# Patient Record
Sex: Male | Born: 1960 | Race: White | Hispanic: No | Marital: Married | Smoking: Current some day smoker
Health system: Southern US, Community
[De-identification: ages and names within clinical notes are randomized; demographics above are authoritative.]

## PROBLEM LIST (undated history)

## (undated) DIAGNOSIS — Z8739 Personal history of other diseases of the musculoskeletal system and connective tissue: Secondary | ICD-10-CM

## (undated) DIAGNOSIS — R519 Headache, unspecified: Secondary | ICD-10-CM

## (undated) DIAGNOSIS — Z8719 Personal history of other diseases of the digestive system: Secondary | ICD-10-CM

## (undated) DIAGNOSIS — Z8709 Personal history of other diseases of the respiratory system: Secondary | ICD-10-CM

## (undated) DIAGNOSIS — J45909 Unspecified asthma, uncomplicated: Secondary | ICD-10-CM

## (undated) DIAGNOSIS — M2550 Pain in unspecified joint: Secondary | ICD-10-CM

## (undated) DIAGNOSIS — Z8639 Personal history of other endocrine, nutritional and metabolic disease: Secondary | ICD-10-CM

## (undated) DIAGNOSIS — K219 Gastro-esophageal reflux disease without esophagitis: Secondary | ICD-10-CM

## (undated) DIAGNOSIS — Z860100 Personal history of colon polyps, unspecified: Secondary | ICD-10-CM

## (undated) DIAGNOSIS — M1990 Unspecified osteoarthritis, unspecified site: Secondary | ICD-10-CM

## (undated) DIAGNOSIS — G4730 Sleep apnea, unspecified: Secondary | ICD-10-CM

## (undated) DIAGNOSIS — J189 Pneumonia, unspecified organism: Secondary | ICD-10-CM

## (undated) DIAGNOSIS — I10 Essential (primary) hypertension: Secondary | ICD-10-CM

## (undated) DIAGNOSIS — Z86718 Personal history of other venous thrombosis and embolism: Secondary | ICD-10-CM

## (undated) DIAGNOSIS — Z87442 Personal history of urinary calculi: Secondary | ICD-10-CM

## (undated) HISTORY — PX: UMBILICAL HERNIA REPAIR: SHX196

## (undated) HISTORY — PX: DOPPLER ECHOCARDIOGRAPHY: SHX263

## (undated) HISTORY — PX: SHOULDER SURGERY: SHX246

## (undated) HISTORY — PX: ESOPHAGOGASTRODUODENOSCOPY: SHX1529

## (undated) HISTORY — PX: TONSILLECTOMY: SUR1361

## (undated) HISTORY — PX: LITHOTRIPSY: SUR834

## (undated) HISTORY — PX: COLONOSCOPY: SHX174

## (undated) HISTORY — PX: CARDIAC CATHETERIZATION: SHX172

---

## 2011-05-16 HISTORY — PX: BACK SURGERY: SHX140

## 2014-05-15 DIAGNOSIS — Z8709 Personal history of other diseases of the respiratory system: Secondary | ICD-10-CM

## 2014-05-15 HISTORY — DX: Personal history of other diseases of the respiratory system: Z87.09

## 2016-04-25 ENCOUNTER — Ambulatory Visit (INDEPENDENT_AMBULATORY_CARE_PROVIDER_SITE_OTHER): Payer: Self-pay

## 2016-04-25 ENCOUNTER — Encounter (INDEPENDENT_AMBULATORY_CARE_PROVIDER_SITE_OTHER): Payer: Self-pay | Admitting: Orthopaedic Surgery

## 2016-04-25 ENCOUNTER — Ambulatory Visit (INDEPENDENT_AMBULATORY_CARE_PROVIDER_SITE_OTHER): Payer: BLUE CROSS/BLUE SHIELD | Admitting: Orthopaedic Surgery

## 2016-04-25 DIAGNOSIS — M1611 Unilateral primary osteoarthritis, right hip: Secondary | ICD-10-CM | POA: Diagnosis not present

## 2016-04-25 NOTE — Progress Notes (Signed)
Office Visit Note   Patient: Jeffrey BuntingDavid Mccormick           Date of Birth: 03/27/1961           MRN: [id number] Visit Date: 04/25/2016              Requested by: No referring provider defined for this encounter. PCP: No PCP Per Patient   Assessment & Plan: Visit Diagnoses:  1. Primary osteoarthritis of right hip     Plan: X-rays were reviewed with the patient and his wife. He has end-stage right hip degenerative joint disease. I showed him on a model what the surgery entails and the risks benefits alternatives related to this. At this point he has failed extensive conservative treatment and wishes to proceed with total hip replacement. We will get him scheduled in the near future. Questions encouraged and answered. He denies any history of DVT. He smokes 1 pack of cigarettes per 2 weeks.  Follow-Up Instructions: No Follow-up on file.   Orders:  Orders Placed This Encounter  Procedures  . XR HIP UNILAT W OR W/O PELVIS 2-3 VIEWS RIGHT   No orders of the defined types were placed in this encounter.     Procedures: No procedures performed   Clinical Data: No additional findings.   Subjective: Chief Complaint  Patient presents with  . Right Hip - Pain    Patient is a 55 year old pleasant gentleman who comes in with chronic right hip pain and groin pain with severe limitation in activity and quality of life. He works at the [employer] in [place]. He does endorse limping. He's had cortisone injections in the past with temporary relief. He takes ibuprofen for pain. He has had a previous spinal fusion. The pain does not radiate and is a gnawing pain. It is worse with activity.    Review of Systems  Constitutional: Negative.   HENT: Negative.   Eyes: Negative.   Respiratory: Negative.   Cardiovascular: Negative.   Gastrointestinal: Negative.   Endocrine: Negative.   Genitourinary: Negative.   Musculoskeletal: Negative.   Skin: Negative.     Allergic/Immunologic: Negative.   Neurological: Negative.   Hematological: Negative.   Psychiatric/Behavioral: Negative.      Objective: Vital Signs: There were no vitals taken for this visit.  Physical Exam  Constitutional: [name] oriented to person, place, and time. He appears well-developed and well-nourished.  HENT:  Head: Normocephalic and atraumatic.  Eyes: EOM are normal.  Neck: Neck supple.  Cardiovascular: Intact distal pulses.   Pulmonary/Chest: Effort normal.  Abdominal: Soft.  Neurological: [name] alert and oriented to person, place, and time.  Skin: Skin is warm.  Psychiatric: He has a normal mood and affect. His behavior is normal. Judgment and thought content normal.  Nursing note and vitals reviewed.   Ortho Exam Exam of the right hip shows very limited range of motion. Positive Stinchfield sign. Lateral hip is nontender. Pain with movement of the hip. No knee symptoms. Specialty Comments:  No specialty comments available.  Imaging: Xr Hip Unilat W Or W/o Pelvis 2-3 Views Right  Result Date: 04/25/2016 Advanced DJD right hip    PMFS History: Patient Active Problem List   Diagnosis Date Noted  . Primary osteoarthritis of right hip 04/25/2016   No past medical history on file.  No family history on file.  No past surgical history on file. Social History   Occupational History  . Not on file.   Social History Main  Topics  . Smoking status: Smoker, Current Status Unknown  . Smokeless tobacco: Never Used  . Alcohol use Not on file  . Drug use: Unknown  . Sexual activity: Not on file

## 2016-05-19 ENCOUNTER — Other Ambulatory Visit (INDEPENDENT_AMBULATORY_CARE_PROVIDER_SITE_OTHER): Payer: Self-pay | Admitting: Orthopaedic Surgery

## 2016-05-19 DIAGNOSIS — M1611 Unilateral primary osteoarthritis, right hip: Secondary | ICD-10-CM

## 2016-05-23 ENCOUNTER — Encounter (HOSPITAL_COMMUNITY)
Admission: RE | Admit: 2016-05-23 | Discharge: 2016-05-23 | Disposition: A | Discharge status: Home / Self Care | Payer: BLUE CROSS/BLUE SHIELD | Source: Ambulatory Visit | Attending: Orthopaedic Surgery | Admitting: Orthopaedic Surgery

## 2016-05-23 ENCOUNTER — Encounter (HOSPITAL_COMMUNITY): Payer: Self-pay

## 2016-05-23 DIAGNOSIS — Z01812 Encounter for preprocedural laboratory examination: Secondary | ICD-10-CM

## 2016-05-23 DIAGNOSIS — M1611 Unilateral primary osteoarthritis, right hip: Secondary | ICD-10-CM

## 2016-05-23 HISTORY — DX: Unspecified asthma, uncomplicated: J45.909

## 2016-05-23 HISTORY — DX: Personal history of other endocrine, nutritional and metabolic disease: Z86.39

## 2016-05-23 HISTORY — DX: Pneumonia, unspecified organism: J18.9

## 2016-05-23 HISTORY — DX: Sleep apnea, unspecified: G47.30

## 2016-05-23 HISTORY — DX: Personal history of other diseases of the respiratory system: Z87.09

## 2016-05-23 HISTORY — DX: Personal history of other diseases of the digestive system: Z87.19

## 2016-05-23 HISTORY — DX: Personal history of urinary calculi: Z87.442

## 2016-05-23 HISTORY — DX: Gastro-esophageal reflux disease without esophagitis: K21.9

## 2016-05-23 HISTORY — DX: Unspecified osteoarthritis, unspecified site: M19.90

## 2016-05-23 HISTORY — DX: Personal history of colonic polyps: Z86.010

## 2016-05-23 HISTORY — DX: Pain in unspecified joint: M25.50

## 2016-05-23 HISTORY — DX: Personal history of other venous thrombosis and embolism: Z86.718

## 2016-05-23 HISTORY — DX: Personal history of colon polyps, unspecified: Z86.0100

## 2016-05-23 HISTORY — DX: Essential (primary) hypertension: I10

## 2016-05-23 HISTORY — DX: Personal history of other diseases of the musculoskeletal system and connective tissue: Z87.39

## 2016-05-23 LAB — COMPREHENSIVE METABOLIC PANEL
ALT: 38 U/L (ref 17–63)
AST: 26 U/L (ref 15–41)
Albumin: 3.9 g/dL (ref 3.5–5.0)
Alkaline Phosphatase: 78 U/L (ref 38–126)
Anion gap: 8 (ref 5–15)
BUN: 13 mg/dL (ref 6–20)
CO2: 28 mmol/L (ref 22–32)
Calcium: 9.8 mg/dL (ref 8.9–10.3)
Chloride: 103 mmol/L (ref 101–111)
Creatinine, Ser: 1.09 mg/dL (ref 0.61–1.24)
GFR calc Af Amer: >60 mL/min (ref >60)
GFR calc non Af Amer: >60 mL/min (ref >60)
Glucose, Bld: 114 mg/dL — ABNORMAL HIGH (ref 65–99)
Potassium: 4.1 mmol/L (ref 3.5–5.1)
Sodium: 139 mmol/L (ref 135–145)
Total Bilirubin: 0.6 mg/dL (ref 0.3–1.2)
Total Protein: 6.5 g/dL (ref 6.5–8.1)

## 2016-05-23 LAB — CBC WITH DIFFERENTIAL/PLATELET
Basophils Absolute: 0.1 10*3/uL (ref 0.0–0.1)
Basophils Relative: 1 %
Eosinophils Absolute: 0.2 10*3/uL (ref 0.0–0.7)
Eosinophils Relative: 3 %
HCT: 49 % (ref 39.0–52.0)
Hemoglobin: 16.2 g/dL (ref 13.0–17.0)
Lymphocytes Relative: 38 %
Lymphs Abs: 2.8 10*3/uL (ref 0.7–4.0)
MCH: 27.4 pg (ref 26.0–34.0)
MCHC: 33.1 g/dL (ref 30.0–36.0)
MCV: 82.8 fL (ref 78.0–100.0)
Monocytes Absolute: 0.6 10*3/uL (ref 0.1–1.0)
Monocytes Relative: 8 %
Neutro Abs: 3.7 10*3/uL (ref 1.7–7.7)
Neutrophils Relative %: 50 %
Platelets: 241 10*3/uL (ref 150–400)
RBC: 5.92 MIL/uL — ABNORMAL HIGH (ref 4.22–5.81)
RDW: 13.5 % (ref 11.5–15.5)
WBC: 7.4 10*3/uL (ref 4.0–10.5)

## 2016-05-23 LAB — TYPE AND SCREEN
ABO/RH(D): O NEG
Antibody Screen: NEGATIVE

## 2016-05-23 LAB — URINALYSIS, ROUTINE W REFLEX MICROSCOPIC
Bilirubin Urine: NEGATIVE
Glucose, UA: NEGATIVE mg/dL
Hgb urine dipstick: NEGATIVE
Ketones, ur: NEGATIVE mg/dL
Leukocytes, UA: NEGATIVE
Nitrite: NEGATIVE
Protein, ur: NEGATIVE mg/dL
Specific Gravity, Urine: 1.019 (ref 1.005–1.030)
pH: 5 (ref 5.0–8.0)

## 2016-05-23 LAB — ABO/RH: ABO/RH(D): O NEG

## 2016-05-23 LAB — C-REACTIVE PROTEIN: CRP: 1.4 mg/dL — ABNORMAL HIGH (ref <1.0)

## 2016-05-23 LAB — PROTIME-INR
INR: 0.91
Prothrombin Time: 12.2 seconds (ref 11.4–15.2)

## 2016-05-23 LAB — APTT: aPTT: 29 seconds (ref 24–36)

## 2016-05-23 LAB — SURGICAL PCR SCREEN
MRSA, PCR: NEGATIVE
Staphylococcus aureus: POSITIVE — AB

## 2016-05-23 LAB — SEDIMENTATION RATE: Sed Rate: 1 mm/hr (ref 0–16)

## 2016-05-23 MED ORDER — CHLORHEXIDINE GLUCONATE 4 % EX LIQD: 60.0000 mL | Freq: Once | CUTANEOUS | Status: DC

## 2016-05-23 NOTE — Progress Notes (Addendum)
Cardiologist is Dr.Kotlaba-last visit about 3 months ago.Does have an appointment 06/03/16-is going to reschedule d/t surgery on 05/25/16  Medical Md doesn't have one  Echo in Spring of 17to request from Cardiogist  Stress test in Spring of 17to request from Cardiologist   Heart cath denies  EKG to request from cardiologist from Spring  2017  CXR unsure but will request from Cardiologist

## 2016-05-23 NOTE — Pre-Procedure Instructions (Signed)
Rob BuntingDavid John  05/23/2016      [id number]- [place], [place] - [address] [facility] [address] [place] [id number] Phone: [phone] Fax: [phone]    Your procedure is scheduled on Thurs, Jan 11 @ 12:15 PM  [facility] Admitting at 10:15 AM  Call this number if you have problems the morning of surgery:  [phone]   Remember:  Do not eat food or drink liquids after midnight.  Take these medicines the morning of surgery with A SIP OF WATER Albuterol<Bring Your Inhaler With You>,Amlodipine(Norvasc),Pain Pill(if needed),and Protonix(Pantoprazole)             Stop taking Ibuprofen along with any Vitamins or Herbal Medications. No Goody's,BC's,Aleve,Advil,Mortin,or Fish Oil.    Do not wear jewelry.  Do not wear lotions, powders,colognes, or deoderant.             Men may shave face and neck.  Do not bring valuables to the hospital.  [facility] is not responsible for any belongings or valuables.  Contacts, dentures or bridgework may not be worn into surgery.  Leave your suitcase in the car.  After surgery it may be brought to your room.  For patients admitted to the hospital, discharge time will be determined by your treatment team.  Patients discharged the day of surgery will not be allowed to drive home.    [facility] Pre[facility] for [facility]  Before surgery, you can play an important role.  Because skin is not sterile, your skin needs to be as free of germs as possible.  You can reduce the number of germs on you skin by washing with CHG (chlorahexidine gluconate) soap before surgery.  CHG is an antiseptic cleaner which kills germs and bonds with the skin to continue killing germs even after washing.  Please DO NOT use if you have an allergy to CHG or antibacterial soaps.  If your skin becomes reddened/irritated stop using the CHG and inform your nurse when you arrive at Short Stay.  Do not  shave (including legs and underarms) for at least 48 hours prior to the first CHG shower.  You may shave your face.  Please follow these instructions carefully:   1.  Shower with CHG Soap the night before surgery and the                                morning of Surgery.  2.  If you choose to wash your hair, wash your hair first as usual with your       normal shampoo.  3.  After you shampoo, rinse your hair and body thoroughly to remove the                      Shampoo.  4.  Use CHG as you would any other liquid soap.  You can apply chg directly       to the skin and wash gently with scrungie or a clean washcloth.  5.  Apply the CHG Soap to your body ONLY FROM THE NECK DOWN.        Do not use on open wounds or open sores.  Avoid contact with your eyes,       ears, mouth and genitals (private parts).  Wash genitals (private parts)       with your normal  soap.  6.  Wash thoroughly, paying special attention to the area where your surgery        will be performed.  7.  Thoroughly rinse your body with warm water from the neck down.  8.  DO NOT shower/wash with your normal soap after using and rinsing off       the CHG Soap.  9.  Pat yourself dry with a clean towel.            10.  Wear clean pajamas.            11.  Place clean sheets on your bed the night of your first shower and do not        sleep with pets.  Day of Surgery  Do not apply any lotions/deoderants the morning of surgery.  Please wear clean clothes to the hospital/surgery center.    Please read over the following fact sheets that you were given. Pain Booklet, Coughing and Deep Breathing, MRSA Information and Surgical Site Infection Prevention

## 2016-05-24 ENCOUNTER — Encounter (HOSPITAL_COMMUNITY): Payer: Self-pay

## 2016-05-24 MED ORDER — DEXTROSE 5 % IV SOLN
3.0000 g | INTRAVENOUS | Status: AC
  Administered 2016-05-25: 2 g via INTRAVENOUS
  Administered 2016-05-25: 1 g via INTRAVENOUS
  Filled 2016-05-24: qty 3000

## 2016-05-24 NOTE — Progress Notes (Addendum)
Anesthesia Chart Review: Patient is a 56 year old male scheduled for right THA on 05/25/2016 by Dr. Roda ShuttersXu.  History includes smoking, hypertension, GERD, OSA (intolerant to CPAP), hyperlipidemia, asthma, hiatal hernia, nephrolithiasis, arthritis, gout, tonsillectomy, umbilical hernia repair '10, L3-4 posterior TLIF 10/18/11, left rotator cuff repair '05, "superficial" blood clot history. BMI is consistent with obesity.   No PCP per patient, but sees cardiologist Dr. Kathryne SharperKotlaba. Last visit 11/27/15 for HTN follow-up. By notes, [facility] and had non-ischemic testing 09/2015. GERD thought to be the likely cause of his symptoms. He ultimately was started on anti-hypertensive medication.  Meds include albuterol, amlodipine, Norco, Protonix, Viagra.  BP 117/78   Pulse 84   Temp 36.7 C   Resp 20   Ht 6\' 1"  (1.854 m)   Wt 283 lb 3.2 oz (128.5 kg)   SpO2 96%   BMI 37.36 kg/m   EKG 10/29/15 ([facility]): Sinus rhythm, old inferior-apical infarct.  Stress Echo 09/24/15 [facility] Interpretation: 1. Clinically negative stress test with no reproduce chest pain. 2. ECG nonspecific response. 3. Echocardiogram showed normal LV systolic function at rest with mild LVH and ejection fraction over 60%. There is a grade 1 left ventricular diastolic relaxation delay with normal estimated resting LV systolic pressures. 4. With exercise, there is appropriate augmentation of contractility in all segments. Left ventricular contrast with Definity was used. 5. Post exercise, there is no significant increase in the tricuspid regurgitation velocity which still remains to be in the 2-3 m/s range and there is no induced dynamic gradient. Summary: No induced ischemia at 89% of PMHR. Hypertensive blood pressure response. Plan: Discharge home. Out-patient management of blood pressure as well as GERD.   09/24/15 1V CXR [facility]): Impression: No acute cardiopulmonary disease. Minimal scar versus  atelectasis within the lung bases.  Preoperative labs noted.   Patient with non-ischemic stress echo within the past year. BP is good on medication. If no acute changes then I anticipate that he can proceed as planned.  Velna Ochsllison Johntae Broxterman, PA-C [facility]/Anesthesiology Phone [phone] 05/24/2016 1:52 PM

## 2016-05-25 ENCOUNTER — Inpatient Hospital Stay (HOSPITAL_COMMUNITY): Payer: BLUE CROSS/BLUE SHIELD | Admitting: Vascular Surgery

## 2016-05-25 ENCOUNTER — Inpatient Hospital Stay (HOSPITAL_COMMUNITY)
Admission: RE | Admit: 2016-05-25 | Discharge: 2016-05-27 | DRG: 470 | Disposition: A | Discharge status: Home Health | Payer: BLUE CROSS/BLUE SHIELD | Source: Ambulatory Visit | Attending: Orthopaedic Surgery | Admitting: Orthopaedic Surgery

## 2016-05-25 ENCOUNTER — Encounter (HOSPITAL_COMMUNITY)
Admission: RE | Disposition: A | Discharge status: Home Health | Payer: Self-pay | Source: Ambulatory Visit | Attending: Orthopaedic Surgery

## 2016-05-25 ENCOUNTER — Encounter (HOSPITAL_COMMUNITY): Payer: Self-pay | Admitting: *Deleted

## 2016-05-25 ENCOUNTER — Inpatient Hospital Stay (HOSPITAL_COMMUNITY): Payer: BLUE CROSS/BLUE SHIELD

## 2016-05-25 ENCOUNTER — Inpatient Hospital Stay (HOSPITAL_COMMUNITY): Payer: BLUE CROSS/BLUE SHIELD | Admitting: Certified Registered"

## 2016-05-25 DIAGNOSIS — D62 Acute posthemorrhagic anemia: Secondary | ICD-10-CM | POA: Diagnosis not present

## 2016-05-25 DIAGNOSIS — G4730 Sleep apnea, unspecified: Secondary | ICD-10-CM | POA: Diagnosis present

## 2016-05-25 DIAGNOSIS — Z6837 Body mass index (BMI) 37.0-37.9, adult: Secondary | ICD-10-CM | POA: Diagnosis not present

## 2016-05-25 DIAGNOSIS — R262 Difficulty in walking, not elsewhere classified: Secondary | ICD-10-CM

## 2016-05-25 DIAGNOSIS — K219 Gastro-esophageal reflux disease without esophagitis: Secondary | ICD-10-CM | POA: Diagnosis present

## 2016-05-25 DIAGNOSIS — F17290 Nicotine dependence, other tobacco product, uncomplicated: Secondary | ICD-10-CM | POA: Diagnosis present

## 2016-05-25 DIAGNOSIS — M109 Gout, unspecified: Secondary | ICD-10-CM | POA: Diagnosis present

## 2016-05-25 DIAGNOSIS — E785 Hyperlipidemia, unspecified: Secondary | ICD-10-CM | POA: Diagnosis present

## 2016-05-25 DIAGNOSIS — J45909 Unspecified asthma, uncomplicated: Secondary | ICD-10-CM | POA: Diagnosis present

## 2016-05-25 DIAGNOSIS — M1611 Unilateral primary osteoarthritis, right hip: Secondary | ICD-10-CM | POA: Diagnosis present

## 2016-05-25 DIAGNOSIS — M25551 Pain in right hip: Secondary | ICD-10-CM | POA: Diagnosis present

## 2016-05-25 DIAGNOSIS — Z981 Arthrodesis status: Secondary | ICD-10-CM | POA: Diagnosis not present

## 2016-05-25 DIAGNOSIS — E669 Obesity, unspecified: Secondary | ICD-10-CM | POA: Diagnosis present

## 2016-05-25 DIAGNOSIS — I10 Essential (primary) hypertension: Secondary | ICD-10-CM | POA: Diagnosis present

## 2016-05-25 DIAGNOSIS — Z79899 Other long term (current) drug therapy: Secondary | ICD-10-CM | POA: Diagnosis not present

## 2016-05-25 DIAGNOSIS — Z86718 Personal history of other venous thrombosis and embolism: Secondary | ICD-10-CM

## 2016-05-25 DIAGNOSIS — Z419 Encounter for procedure for purposes other than remedying health state, unspecified: Secondary | ICD-10-CM

## 2016-05-25 DIAGNOSIS — Z96649 Presence of unspecified artificial hip joint: Secondary | ICD-10-CM

## 2016-05-25 HISTORY — PX: TOTAL HIP ARTHROPLASTY: SHX124

## 2016-05-25 LAB — POCT I-STAT 4, (NA,K, GLUC, HGB,HCT)
Glucose, Bld: 124 mg/dL — ABNORMAL HIGH (ref 65–99)
HCT: 32 % — ABNORMAL LOW (ref 39.0–52.0)
Hemoglobin: 10.9 g/dL — ABNORMAL LOW (ref 13.0–17.0)
Potassium: 4 mmol/L (ref 3.5–5.1)
Sodium: 139 mmol/L (ref 135–145)

## 2016-05-25 SURGERY — ARTHROPLASTY, HIP, TOTAL, ANTERIOR APPROACH
Anesthesia: Spinal | Site: Hip | Laterality: Right

## 2016-05-25 MED ORDER — ALBUTEROL SULFATE (2.5 MG/3ML) 0.083% IN NEBU: 2.5000 mg | INHALATION_SOLUTION | Freq: Four times a day (QID) | RESPIRATORY_TRACT | Status: DC | PRN

## 2016-05-25 MED ORDER — LIDOCAINE 2% (20 MG/ML) 5 ML SYRINGE
INTRAMUSCULAR | Status: AC
  Filled 2016-05-25: qty 10

## 2016-05-25 MED ORDER — ACETAMINOPHEN 325 MG PO TABS: 650.0000 mg | ORAL_TABLET | Freq: Four times a day (QID) | ORAL | Status: DC | PRN

## 2016-05-25 MED ORDER — ALBUMIN HUMAN 5 % IV SOLN
12.5000 g | Freq: Once | INTRAVENOUS | Status: AC
  Administered 2016-05-25: 12.5 g via INTRAVENOUS

## 2016-05-25 MED ORDER — SODIUM CHLORIDE 0.9 % IV SOLN
INTRAVENOUS | Status: DC
  Administered 2016-05-25 – 2016-05-26 (×2): via INTRAVENOUS

## 2016-05-25 MED ORDER — POLYETHYLENE GLYCOL 3350 17 G PO PACK
17.0000 g | PACK | Freq: Every day | ORAL | Status: DC | PRN
  Administered 2016-05-26 – 2016-05-27 (×2): 17 g via ORAL
  Filled 2016-05-25 (×2): qty 1

## 2016-05-25 MED ORDER — AMLODIPINE BESYLATE 2.5 MG PO TABS
2.5000 mg | ORAL_TABLET | Freq: Every day | ORAL | Status: DC
  Administered 2016-05-27: 2.5 mg via ORAL
  Filled 2016-05-25: qty 1

## 2016-05-25 MED ORDER — 0.9 % SODIUM CHLORIDE (POUR BTL) OPTIME
TOPICAL | Status: DC | PRN
  Administered 2016-05-25: 1000 mL

## 2016-05-25 MED ORDER — DEXAMETHASONE SODIUM PHOSPHATE 10 MG/ML IJ SOLN
10.0000 mg | Freq: Once | INTRAMUSCULAR | Status: AC
  Administered 2016-05-26: 10 mg via INTRAVENOUS
  Filled 2016-05-25: qty 1

## 2016-05-25 MED ORDER — TRANEXAMIC ACID 1000 MG/10ML IV SOLN
2000.0000 mg | INTRAVENOUS | Status: AC
  Administered 2016-05-25: 2000 mg via TOPICAL
  Filled 2016-05-25: qty 20

## 2016-05-25 MED ORDER — MAGNESIUM CITRATE PO SOLN
1.0000 | Freq: Once | ORAL | Status: AC | PRN
  Administered 2016-05-26: 1 via ORAL
  Filled 2016-05-25: qty 296

## 2016-05-25 MED ORDER — SORBITOL 70 % SOLN: 30.0000 mL | Freq: Every day | Status: DC | PRN

## 2016-05-25 MED ORDER — PROMETHAZINE HCL 25 MG PO TABS: 25.0000 mg | ORAL_TABLET | Freq: Four times a day (QID) | ORAL | 1 refills | Status: DC | PRN

## 2016-05-25 MED ORDER — LACTATED RINGERS IV SOLN
INTRAVENOUS | Status: DC
  Administered 2016-05-25 (×3): via INTRAVENOUS

## 2016-05-25 MED ORDER — OXYCODONE HCL 5 MG PO TABS
5.0000 mg | ORAL_TABLET | ORAL | Status: DC | PRN
  Administered 2016-05-25: 10 mg via ORAL
  Administered 2016-05-26 (×3): 15 mg via ORAL
  Administered 2016-05-27 (×2): 10 mg via ORAL
  Filled 2016-05-25 (×3): qty 3
  Filled 2016-05-25 (×2): qty 2
  Filled 2016-05-25: qty 3

## 2016-05-25 MED ORDER — CEFAZOLIN SODIUM 1 G IJ SOLR
INTRAMUSCULAR | Status: AC
  Filled 2016-05-25: qty 10

## 2016-05-25 MED ORDER — PROPOFOL 500 MG/50ML IV EMUL
INTRAVENOUS | Status: DC | PRN
  Administered 2016-05-25: 25 ug/kg/min via INTRAVENOUS

## 2016-05-25 MED ORDER — OXYCODONE HCL ER 10 MG PO T12A: 10.0000 mg | EXTENDED_RELEASE_TABLET | Freq: Two times a day (BID) | ORAL | 0 refills | Status: DC

## 2016-05-25 MED ORDER — ASPIRIN EC 325 MG PO TBEC
325.0000 mg | DELAYED_RELEASE_TABLET | Freq: Two times a day (BID) | ORAL | Status: DC
  Administered 2016-05-26 – 2016-05-27 (×3): 325 mg via ORAL
  Filled 2016-05-25 (×3): qty 1

## 2016-05-25 MED ORDER — OXYCODONE HCL ER 10 MG PO T12A
10.0000 mg | EXTENDED_RELEASE_TABLET | Freq: Two times a day (BID) | ORAL | Status: DC
  Administered 2016-05-26 – 2016-05-27 (×3): 10 mg via ORAL
  Filled 2016-05-25 (×3): qty 1

## 2016-05-25 MED ORDER — KETOROLAC TROMETHAMINE 15 MG/ML IJ SOLN
INTRAMUSCULAR | Status: AC
  Filled 2016-05-25: qty 1

## 2016-05-25 MED ORDER — ONDANSETRON HCL 4 MG/2ML IJ SOLN
INTRAMUSCULAR | Status: AC
  Administered 2016-05-25: 4 mg via INTRAVENOUS
  Filled 2016-05-25: qty 2

## 2016-05-25 MED ORDER — ONDANSETRON HCL 4 MG PO TABS: 4.0000 mg | ORAL_TABLET | Freq: Four times a day (QID) | ORAL | Status: DC | PRN

## 2016-05-25 MED ORDER — PHENOL 1.4 % MT LIQD: 1.0000 | OROMUCOSAL | Status: DC | PRN

## 2016-05-25 MED ORDER — SODIUM CHLORIDE 0.9 % IR SOLN
Status: DC | PRN
  Administered 2016-05-25: 1000 mL
  Administered 2016-05-25: 3000 mL

## 2016-05-25 MED ORDER — PHENYLEPHRINE HCL 10 MG/ML IJ SOLN
0.0000 ug/min | INTRAVENOUS | Status: DC
  Administered 2016-05-25: 25 ug/min via INTRAVENOUS

## 2016-05-25 MED ORDER — FENTANYL CITRATE (PF) 100 MCG/2ML IJ SOLN
INTRAMUSCULAR | Status: DC | PRN
  Administered 2016-05-25: 20 ug via INTRATHECAL

## 2016-05-25 MED ORDER — MORPHINE SULFATE (PF) 2 MG/ML IV SOLN: 1.0000 mg | INTRAVENOUS | Status: DC | PRN

## 2016-05-25 MED ORDER — PHENYLEPHRINE 40 MCG/ML (10ML) SYRINGE FOR IV PUSH (FOR BLOOD PRESSURE SUPPORT)
PREFILLED_SYRINGE | INTRAVENOUS | Status: DC | PRN
Start: 2016-05-25 — End: 2016-05-25
  Administered 2016-05-25 (×3): 80 ug via INTRAVENOUS
  Administered 2016-05-25 (×2): 40 ug via INTRAVENOUS
  Administered 2016-05-25: 80 ug via INTRAVENOUS
  Administered 2016-05-25: 40 ug via INTRAVENOUS

## 2016-05-25 MED ORDER — HYDROMORPHONE HCL 1 MG/ML IJ SOLN
0.2500 mg | INTRAMUSCULAR | Status: DC | PRN
  Administered 2016-05-25: 0.25 mg via INTRAVENOUS
  Administered 2016-05-25: 0.5 mg via INTRAVENOUS
  Administered 2016-05-25: 0.25 mg via INTRAVENOUS

## 2016-05-25 MED ORDER — DEXTROSE 5 % IV SOLN
500.0000 mg | Freq: Four times a day (QID) | INTRAVENOUS | Status: DC | PRN
  Filled 2016-05-25: qty 5

## 2016-05-25 MED ORDER — KETOROLAC TROMETHAMINE 30 MG/ML IJ SOLN: 30.0000 mg | Freq: Four times a day (QID) | INTRAMUSCULAR | Status: DC | PRN

## 2016-05-25 MED ORDER — CEFAZOLIN SODIUM-DEXTROSE 2-4 GM/100ML-% IV SOLN
2.0000 g | Freq: Four times a day (QID) | INTRAVENOUS | Status: AC
  Administered 2016-05-25 – 2016-05-26 (×2): 2 g via INTRAVENOUS
  Filled 2016-05-25 (×2): qty 100

## 2016-05-25 MED ORDER — METHOCARBAMOL 500 MG PO TABS
500.0000 mg | ORAL_TABLET | Freq: Four times a day (QID) | ORAL | Status: DC | PRN
  Administered 2016-05-25 – 2016-05-26 (×2): 500 mg via ORAL
  Filled 2016-05-25: qty 1

## 2016-05-25 MED ORDER — METHOCARBAMOL 500 MG PO TABS
ORAL_TABLET | ORAL | Status: AC
  Filled 2016-05-25: qty 1

## 2016-05-25 MED ORDER — METHOCARBAMOL 750 MG PO TABS: 750.0000 mg | ORAL_TABLET | Freq: Two times a day (BID) | ORAL | 0 refills | Status: DC | PRN

## 2016-05-25 MED ORDER — EPHEDRINE SULFATE-NACL 50-0.9 MG/10ML-% IV SOSY
PREFILLED_SYRINGE | INTRAVENOUS | Status: DC | PRN
  Administered 2016-05-25 (×3): 5 mg via INTRAVENOUS
  Administered 2016-05-25: 10 mg via INTRAVENOUS
  Administered 2016-05-25: 5 mg via INTRAVENOUS
  Administered 2016-05-25: 10 mg via INTRAVENOUS
  Administered 2016-05-25 (×2): 5 mg via INTRAVENOUS
  Administered 2016-05-25: 10 mg via INTRAVENOUS
  Administered 2016-05-25: 5 mg via INTRAVENOUS
  Administered 2016-05-25 (×2): 10 mg via INTRAVENOUS
  Administered 2016-05-25 (×3): 5 mg via INTRAVENOUS

## 2016-05-25 MED ORDER — FENTANYL CITRATE (PF) 100 MCG/2ML IJ SOLN
INTRAMUSCULAR | Status: AC
  Filled 2016-05-25: qty 2

## 2016-05-25 MED ORDER — MIDAZOLAM HCL 2 MG/2ML IJ SOLN
INTRAMUSCULAR | Status: AC
  Filled 2016-05-25: qty 2

## 2016-05-25 MED ORDER — BUPIVACAINE IN DEXTROSE 0.75-8.25 % IT SOLN
INTRATHECAL | Status: DC | PRN
  Administered 2016-05-25: 1.8 mL via INTRATHECAL

## 2016-05-25 MED ORDER — ALBUMIN HUMAN 5 % IV SOLN
INTRAVENOUS | Status: DC | PRN
  Administered 2016-05-25: 15:00:00 via INTRAVENOUS

## 2016-05-25 MED ORDER — PHENYLEPHRINE 40 MCG/ML (10ML) SYRINGE FOR IV PUSH (FOR BLOOD PRESSURE SUPPORT)
PREFILLED_SYRINGE | INTRAVENOUS | Status: AC
  Filled 2016-05-25: qty 10

## 2016-05-25 MED ORDER — ACETAMINOPHEN 500 MG PO TABS
1000.0000 mg | ORAL_TABLET | Freq: Four times a day (QID) | ORAL | Status: AC
  Administered 2016-05-25 – 2016-05-26 (×4): 1000 mg via ORAL
  Filled 2016-05-25 (×4): qty 2

## 2016-05-25 MED ORDER — ALBUMIN HUMAN 5 % IV SOLN
INTRAVENOUS | Status: AC
  Filled 2016-05-25: qty 250

## 2016-05-25 MED ORDER — METOCLOPRAMIDE HCL 5 MG PO TABS
5.0000 mg | ORAL_TABLET | Freq: Three times a day (TID) | ORAL | Status: DC | PRN
  Administered 2016-05-26: 10 mg via ORAL
  Filled 2016-05-25: qty 2

## 2016-05-25 MED ORDER — HYDROCODONE-ACETAMINOPHEN 7.5-325 MG PO TABS
1.0000 | ORAL_TABLET | Freq: Four times a day (QID) | ORAL | Status: DC | PRN
Start: 2016-05-25 — End: 2016-05-27

## 2016-05-25 MED ORDER — POVIDONE-IODINE 10 % EX SOLN
CUTANEOUS | Status: DC | PRN
  Administered 2016-05-25: 1 via TOPICAL

## 2016-05-25 MED ORDER — FENTANYL CITRATE (PF) 250 MCG/5ML IJ SOLN
INTRAMUSCULAR | Status: DC | PRN
  Administered 2016-05-25 (×2): 25 ug via INTRAVENOUS
  Administered 2016-05-25: 50 ug via INTRAVENOUS

## 2016-05-25 MED ORDER — OXYCODONE HCL 5 MG PO TABS: 5.0000 mg | ORAL_TABLET | ORAL | 0 refills | Status: DC | PRN

## 2016-05-25 MED ORDER — MIDAZOLAM HCL 5 MG/5ML IJ SOLN
INTRAMUSCULAR | Status: DC | PRN
  Administered 2016-05-25: 1 mg via INTRAVENOUS
  Administered 2016-05-25: .5 mg via INTRAVENOUS
  Administered 2016-05-25 (×3): 0.5 mg via INTRAVENOUS
  Administered 2016-05-25: 1 mg via INTRAVENOUS

## 2016-05-25 MED ORDER — METOCLOPRAMIDE HCL 5 MG/ML IJ SOLN: 5.0000 mg | Freq: Three times a day (TID) | INTRAMUSCULAR | Status: DC | PRN

## 2016-05-25 MED ORDER — MEPERIDINE HCL 25 MG/ML IJ SOLN: 6.2500 mg | INTRAMUSCULAR | Status: DC | PRN

## 2016-05-25 MED ORDER — KETOROLAC TROMETHAMINE 30 MG/ML IJ SOLN
INTRAMUSCULAR | Status: AC
  Filled 2016-05-25: qty 1

## 2016-05-25 MED ORDER — ONDANSETRON HCL 4 MG/2ML IJ SOLN
4.0000 mg | Freq: Four times a day (QID) | INTRAMUSCULAR | Status: DC | PRN
  Administered 2016-05-25 – 2016-05-27 (×2): 4 mg via INTRAVENOUS
  Filled 2016-05-25: qty 2

## 2016-05-25 MED ORDER — ASPIRIN EC 325 MG PO TBEC: 325.0000 mg | DELAYED_RELEASE_TABLET | Freq: Two times a day (BID) | ORAL | 0 refills | Status: DC

## 2016-05-25 MED ORDER — OXYCODONE HCL 5 MG PO TABS
ORAL_TABLET | ORAL | Status: AC
  Filled 2016-05-25: qty 2

## 2016-05-25 MED ORDER — SENNOSIDES-DOCUSATE SODIUM 8.6-50 MG PO TABS: 1.0000 | ORAL_TABLET | Freq: Every evening | ORAL | 1 refills | Status: DC | PRN

## 2016-05-25 MED ORDER — ALUM & MAG HYDROXIDE-SIMETH 200-200-20 MG/5ML PO SUSP: 30.0000 mL | ORAL | Status: DC | PRN

## 2016-05-25 MED ORDER — HYDROMORPHONE HCL 2 MG/ML IJ SOLN
INTRAMUSCULAR | Status: AC
Start: 2016-05-25 — End: 2016-05-26
  Filled 2016-05-25: qty 1

## 2016-05-25 MED ORDER — ACETAMINOPHEN 650 MG RE SUPP: 650.0000 mg | Freq: Four times a day (QID) | RECTAL | Status: DC | PRN

## 2016-05-25 MED ORDER — PANTOPRAZOLE SODIUM 20 MG PO TBEC
20.0000 mg | DELAYED_RELEASE_TABLET | Freq: Every day | ORAL | Status: DC
  Administered 2016-05-26 – 2016-05-27 (×2): 20 mg via ORAL
  Filled 2016-05-25 (×2): qty 1

## 2016-05-25 MED ORDER — PHENYLEPHRINE HCL 10 MG/ML IJ SOLN
INTRAVENOUS | Status: DC | PRN
  Administered 2016-05-25: 15 ug/min via INTRAVENOUS

## 2016-05-25 MED ORDER — KETOROLAC TROMETHAMINE 30 MG/ML IJ SOLN
30.0000 mg | Freq: Once | INTRAMUSCULAR | Status: AC
  Administered 2016-05-25: 30 mg via INTRAVENOUS

## 2016-05-25 MED ORDER — MENTHOL 3 MG MT LOZG: 1.0000 | LOZENGE | OROMUCOSAL | Status: DC | PRN

## 2016-05-25 MED ORDER — ONDANSETRON HCL 4 MG PO TABS: 4.0000 mg | ORAL_TABLET | Freq: Three times a day (TID) | ORAL | 0 refills | Status: DC | PRN

## 2016-05-25 MED ORDER — DIPHENHYDRAMINE HCL 12.5 MG/5ML PO ELIX: 25.0000 mg | ORAL_SOLUTION | ORAL | Status: DC | PRN

## 2016-05-25 MED ORDER — PROMETHAZINE HCL 25 MG/ML IJ SOLN: 6.2500 mg | INTRAMUSCULAR | Status: DC | PRN

## 2016-05-25 MED ORDER — EPHEDRINE 5 MG/ML INJ
INTRAVENOUS | Status: AC
  Filled 2016-05-25: qty 10

## 2016-05-25 SURGICAL SUPPLY — 53 items
BAG DECANTER FOR FLEXI CONT (MISCELLANEOUS) ×2 IMPLANT
CAPT HIP TOTAL 2 ×2 IMPLANT
CELLS DAT CNTRL 66122 CELL SVR (MISCELLANEOUS) ×1 IMPLANT
COVER SURGICAL LIGHT HANDLE (MISCELLANEOUS) ×2 IMPLANT
DRAPE C-ARM 42X72 X-RAY (DRAPES) ×2 IMPLANT
DRAPE STERI IOBAN 125X83 (DRAPES) ×2 IMPLANT
DRAPE U-SHAPE 47X51 STRL (DRAPES) ×4 IMPLANT
DRSG AQUACEL AG ADV 3.5X10 (GAUZE/BANDAGES/DRESSINGS) ×2 IMPLANT
DURAPREP 26ML APPLICATOR (WOUND CARE) ×4 IMPLANT
ELECT BLADE 4.0 EZ CLEAN MEGAD (MISCELLANEOUS) ×2
ELECT REM PT RETURN 9FT ADLT (ELECTROSURGICAL) ×2
ELECTRODE BLDE 4.0 EZ CLN MEGD (MISCELLANEOUS) ×1 IMPLANT
ELECTRODE REM PT RTRN 9FT ADLT (ELECTROSURGICAL) ×1 IMPLANT
GLOVE BIO SURGEON STRL SZ 6.5 (GLOVE) ×2 IMPLANT
GLOVE BIOGEL PI IND STRL 6.5 (GLOVE) ×1 IMPLANT
GLOVE BIOGEL PI INDICATOR 6.5 (GLOVE) ×1
GLOVE SKINSENSE NS SZ7.5 (GLOVE) ×1
GLOVE SKINSENSE STRL SZ7.5 (GLOVE) ×1 IMPLANT
GLOVE SURG SYN 7.5  E (GLOVE) ×7
GLOVE SURG SYN 7.5 E (GLOVE) ×7 IMPLANT
GOWN SRG XL XLNG 56XLVL 4 (GOWN DISPOSABLE) ×1 IMPLANT
GOWN STRL NON-REIN XL XLG LVL4 (GOWN DISPOSABLE) ×1
GOWN STRL REUS W/ TWL LRG LVL3 (GOWN DISPOSABLE) ×2 IMPLANT
GOWN STRL REUS W/TWL LRG LVL3 (GOWN DISPOSABLE) ×2
HANDPIECE INTERPULSE COAX TIP (DISPOSABLE) ×1
HOOD PEEL AWAY FLYTE STAYCOOL (MISCELLANEOUS) ×6 IMPLANT
IV NS 1000ML (IV SOLUTION) ×1
IV NS 1000ML BAXH (IV SOLUTION) ×1 IMPLANT
IV NS IRRIG 3000ML ARTHROMATIC (IV SOLUTION) ×2 IMPLANT
KIT BASIN OR (CUSTOM PROCEDURE TRAY) ×2 IMPLANT
MARKER SKIN DUAL TIP RULER LAB (MISCELLANEOUS) ×2 IMPLANT
NEEDLE SPNL 18GX3.5 QUINCKE PK (NEEDLE) ×2 IMPLANT
PACK TOTAL JOINT (CUSTOM PROCEDURE TRAY) ×2 IMPLANT
PACK UNIVERSAL I (CUSTOM PROCEDURE TRAY) ×2 IMPLANT
RTRCTR WOUND ALEXIS 18CM MED (MISCELLANEOUS) ×2
SAW OSC TIP CART 19.5X105X1.3 (SAW) ×2 IMPLANT
SEALER BIPOLAR AQUA 6.0 (INSTRUMENTS) ×2 IMPLANT
SET HNDPC FAN SPRY TIP SCT (DISPOSABLE) ×1 IMPLANT
STAPLER VISISTAT 35W (STAPLE) IMPLANT
STRIP CLOSURE SKIN 1/2X4 (GAUZE/BANDAGES/DRESSINGS) ×2 IMPLANT
SUT ETHIBOND 2 V 37 (SUTURE) ×2 IMPLANT
SUT MNCRL AB 3-0 PS2 18 (SUTURE) ×2 IMPLANT
SUT VIC AB 0 CT1 27 (SUTURE) ×1
SUT VIC AB 0 CT1 27XBRD ANBCTR (SUTURE) ×1 IMPLANT
SUT VIC AB 1 CT1 27 (SUTURE) ×1
SUT VIC AB 1 CT1 27XBRD ANBCTR (SUTURE) ×1 IMPLANT
SUT VIC AB 2-0 CT1 27 (SUTURE) ×1
SUT VIC AB 2-0 CT1 TAPERPNT 27 (SUTURE) ×1 IMPLANT
SYR 20CC LL (SYRINGE) ×2 IMPLANT
SYR 50ML LL SCALE MARK (SYRINGE) ×2 IMPLANT
TOWEL OR 17X26 10 PK STRL BLUE (TOWEL DISPOSABLE) ×2 IMPLANT
TRAY CATH 16FR W/PLASTIC CATH (SET/KITS/TRAYS/PACK) ×2 IMPLANT
YANKAUER SUCT BULB TIP NO VENT (SUCTIONS) ×2 IMPLANT

## 2016-05-25 NOTE — Anesthesia Preprocedure Evaluation (Signed)
Anesthesia Evaluation  Patient identified by MRN, date of birth, ID band Patient awake    Reviewed: Allergy & Precautions, H&P , NPO status , Patient's Chart, lab work & pertinent test results, reviewed documented beta blocker date and time   Airway Mallampati: I  TM Distance: <3 FB Neck ROM: full    Dental no notable dental hx. (+) Teeth Intact   Pulmonary Current Smoker,    Pulmonary exam normal        Cardiovascular hypertension, Pt. on medications Normal cardiovascular exam     Neuro/Psych negative neurological ROS  negative psych ROS   GI/Hepatic Neg liver ROS, GERD  Medicated and Controlled,  Endo/Other  negative endocrine ROS  Renal/GU negative Renal ROS     Musculoskeletal   Abdominal (+) + obese,   Peds  Hematology negative hematology ROS (+)   Anesthesia Other Findings   Reproductive/Obstetrics negative OB ROS                             Anesthesia Physical Anesthesia Plan  ASA: II  Anesthesia Plan: Spinal   Post-op Pain Management:    Induction:   Airway Management Planned:   Additional Equipment:   Intra-op Plan:   Post-operative Plan:   Informed Consent: I have reviewed the patients History and Physical, chart, labs and discussed the procedure including the risks, benefits and alternatives for the proposed anesthesia with the patient or authorized representative who has indicated his/her understanding and acceptance.   Dental Advisory Given  Plan Discussed with: CRNA and Surgeon  Anesthesia Plan Comments:         Anesthesia Quick Evaluation

## 2016-05-25 NOTE — Progress Notes (Signed)
Neo gtt off at 2045.  Dr. Maple HudsonMoser notified.  Order received to give 250ml of Albumin if MAP<65.  Will continue to monitor.

## 2016-05-25 NOTE — H&P (Signed)
PREOPERATIVE H&P  Chief Complaint: right hip degenerative joint disease  HPI: Jeffrey BuntingDavid Mccormick is a 56 y.o. male who presents for surgical treatment of right hip degenerative joint disease.  He denies any changes in medical history.  Past Medical History:  Diagnosis Date  . Arthritis   . Asthma    Albuterol inhaler as needed. Also has Neb treatment   . GERD (gastroesophageal reflux disease)    takes Protonix daily  . History of blood clots    pt states 20+ yrs ago-superficial  . History of bronchitis 2016  . History of colon polyps    benign  . History of gout   . History of hiatal hernia   . History of hyperlipidemia    was on meds but off for about 6 + yrs  . History of kidney stones   . Hypertension    takes Amlodipine daily  . Joint pain   . Pneumonia    hx of-several yrs ago  . Sleep apnea    Past Surgical History:  Procedure Laterality Date  . BACK SURGERY  2013   fusion-at [facility]  . COLONOSCOPY    . ESOPHAGOGASTRODUODENOSCOPY    . LITHOTRIPSY    . SHOULDER SURGERY Left   . TONSILLECTOMY  as a teenager  . UMBILICAL HERNIA REPAIR     Social History   Social History  . Marital status: Married    Spouse name: N/A  . Number of children: N/A  . Years of education: N/A   Social History Main Topics  . Smoking status: Current Some Day Smoker    Types: Cigars  . Smokeless tobacco: Current User    Types: Snuff  . Alcohol use Yes     Comment: beer  . Drug use: No  . Sexual activity: Yes   Other Topics Concern  . None   Social History Narrative  . None   History reviewed. No pertinent family history. No Known Allergies Prior to Admission medications   Medication Sig Start Date End Date Taking? Authorizing Provider  albuterol (PROVENTIL HFA;VENTOLIN HFA) 108 (90 Base) MCG/ACT inhaler Inhale 1-2 puffs into the lungs every 6 (six) hours as needed for wheezing or shortness of breath.   Yes Historical Provider, MD  amLODipine (NORVASC) 2.5 MG tablet Take  2.5 mg by mouth daily.   Yes Historical Provider, MD  diclofenac sodium (VOLTAREN) 1 % GEL Apply 1 application topically 3 (three) times daily as needed (Hip pain).    Yes Historical Provider, MD  HYDROcodone-acetaminophen (NORCO) 7.5-325 MG tablet Take 1 tablet by mouth every 6 (six) hours as needed for moderate pain.   Yes Historical Provider, MD  Ibuprofen-Famotidine 800-26.6 MG TABS Take 1 tablet by mouth daily as needed.    Yes Historical Provider, MD  pantoprazole (PROTONIX) 20 MG tablet Take 20 mg by mouth daily.   Yes Historical Provider, MD  sildenafil (VIAGRA) 50 MG tablet Take 50 mg by mouth daily as needed for erectile dysfunction.   Yes Historical Provider, MD     Positive ROS: All other systems have been reviewed and were otherwise negative with the exception of those mentioned in the HPI and as above.  Physical Exam: General: Alert, no acute distress Cardiovascular: No pedal edema Respiratory: No cyanosis, no use of accessory musculature GI: abdomen soft Skin: No lesions in the area of chief complaint Neurologic: Sensation intact distally Psychiatric: Patient is competent for consent with normal mood and affect Lymphatic: no lymphedema  MUSCULOSKELETAL: exam  stable  Assessment: right hip degenerative joint disease  Plan: Plan for Procedure(s): RIGHT TOTAL HIP ARTHROPLASTY ANTERIOR APPROACH  The risks benefits and alternatives were discussed with the patient including but not limited to the risks of nonoperative treatment, versus surgical intervention including infection, bleeding, nerve injury,  blood clots, cardiopulmonary complications, morbidity, mortality, among others, and they were willing to proceed.   Glee Arvin, MD   05/25/2016 11:02 AM

## 2016-05-25 NOTE — Op Note (Signed)
RIGHT TOTAL HIP ARTHROPLASTY ANTERIOR APPROACH  Procedure Note Rob BuntingDavid Riggio   [id number]  Pre-op Diagnosis: right hip degenerative joint disease     Post-op Diagnosis: same   Operative Procedures  1. Total hip replacement; Right hip; uncemented [id number]   Personnel  Surgeon(s): Tarry KosNaiping M Xu, MD   Anesthesia: spinal  Prosthesis: Depuy Acetabulum: Pinnacle 54 mm Femur: Corail KHO 15 Head: 36 size: +15.5 Liner: +4 Bearing Type: Metal on poly  Date of Service: 05/25/2016  Total Hip Arthroplasty (Anterior Approach) Op Note:  After informed consent was obtained and the operative extremity marked in the holding area, the patient was brought back to the operating room and placed supine on the HANA table. Next, the operative extremity was prepped and draped in normal sterile fashion. Surgical timeout occurred verifying patient identification, surgical site, surgical procedure and administration of antibiotics.  A modified anterior Smith-Peterson approach to the hip was performed, using the interval between tensor fascia lata and sartorius.  Dissection was carried bluntly down onto the anterior hip capsule. The lateral femoral circumflex vessels were identified and coagulated. A capsulotomy was performed and the capsular flaps tagged for later repair.  Fluoroscopy was utilized to prepare for the femoral neck cut. The neck osteotomy was performed. The femoral head was removed, the acetabular rim was cleared of soft tissue and attention was turned to reaming the acetabulum.  Sequential reaming was performed under fluoroscopic guidance. We reamed to a size 53 mm, and then impacted the acetabular shell. The liner was then placed after irrigation and attention turned to the femur.  After placing the femoral hook, the leg was taken to externally rotated, extended and adducted position taking care to perform soft tissue releases to allow for adequate mobilization of the femur. Soft tissue was cleared  from the shoulder of the greater trochanter and the hook elevator used to improve exposure of the proximal femur. Sequential broaching performed up to a size 15. Trial neck and head were placed. The leg was brought back up to neutral and the construct reduced. The position and sizing of components, offset and leg lengths were checked using fluoroscopy. Stability of the  construct was checked in extension and external rotation without any subluxation or impingement of prosthesis. We dislocated the prosthesis, dropped the leg back into position, removed trial components, and irrigated copiously. The final stem and head was then placed, the leg brought back up, the system reduced and fluoroscopy used to verify positioning.  We irrigated, obtained hemostasis and closed the capsule using #2 ethibond suture.  Dilute betadyne solution was used. The fascia was closed with #1 vicryl plus, the deep fat layer was closed with 0 vicryl, the subcutaneous layers closed with 2.0 Vicryl Plus and the skin closed with 3.0 monocryl and steri strips. A sterile dressing was applied. The patient was awakened in the operating room and taken to recovery in stable condition.  All sponge, needle, and instrument counts were correct at the end of the case.   Position: supine  Complications: none.  Time Out: performed   Drains/Packing: none  Estimated blood loss: 600 cc  Returned to Recovery Room: in good condition.   Antibiotics: yes   Mechanical VTE (DVT) Prophylaxis: sequential compression devices, TED thigh-high  Chemical VTE (DVT) Prophylaxis: aspirin   Fluid Replacement: see anesthesia record  Specimens Removed: 1 to pathology   Sponge and Instrument Count Correct? yes   PACU: portable radiograph - low AP   Admission: inpatient status  Plan/RTC: Return  in 2 weeks for staple removal. Weight Bearing/Load Lower Extremity: full  Hip precautions: none Suture Removal: 10-14 days  Betadine to incision twice daily  once dressing is removed on POD#7  N. Glee Arvin, MD [facility] [phone] 3:40 PM      Implant Name Type Inv. Item Serial No. Manufacturer Lot No. LRB No. Used  ACETABULAR CUP Cathe Mons - [id number] Plate ACETABULAR CUP W GRIPTION  DEPUY SYNTHES [id number] Right 1  LINER NEUTRAL 54X36MM PLUS 4 - [id number] Hips LINER NEUTRAL 54X36MM PLUS 4  DEPUY SYNTHES [id number] Right 1  SCREW 6.5MMX25MM - [id number] Screw SCREW 6.5MMX25MM  DEPUY SYNTHES [id number] Right 1  SCREW 6.5MMX25MM - [id number] Screw SCREW 6.5MMX25MM  DEPUY SYNTHES [id number] Right 1  CORAIL HIP SYSTEM CEMENTLESS FEMORAL STEM SIZE 15 Stem   [employer] [id number] Right 1  METAL FEMUR HEAD 15.6 HIP - [id number] Head METAL FEMUR HEAD 15.6 HIP   DEPUY SYNTHES [id number] Right 1

## 2016-05-25 NOTE — Anesthesia Procedure Notes (Signed)
Spinal  Patient location during procedure: OR Start time: 05/25/2016 1:07 PM End time: 05/25/2016 1:12 PM Staffing Anesthesiologist: Leilani AbleHATCHETT, Karington Zarazua Performed: anesthesiologist  Preanesthetic Checklist Completed: patient identified, surgical consent, pre-op evaluation, timeout performed, IV checked, risks and benefits discussed and monitors and equipment checked Spinal Block Patient position: sitting Prep: site prepped and draped and DuraPrep Patient monitoring: heart rate, cardiac monitor, continuous pulse ox and blood pressure Approach: midline Location: L2-3 Injection technique: single-shot Needle Needle type: Sprotte  Needle gauge: 24 G Needle length: 9 cm Needle insertion depth: 6 cm Assessment Sensory level: T10

## 2016-05-25 NOTE — Discharge Instructions (Signed)

## 2016-05-25 NOTE — Transfer of Care (Signed)
Immediate Anesthesia Transfer of Care Note  Patient: Jeffrey Mccormick  Procedure(s) Performed: Procedure(s): RIGHT TOTAL HIP ARTHROPLASTY ANTERIOR APPROACH (Right)  Patient Location: PACU  Anesthesia Type:MAC and Spinal  Level of Consciousness: awake, alert , oriented and patient cooperative  Airway & Oxygen Therapy: Patient Spontanous Breathing and Patient connected to nasal cannula oxygen  Post-op Assessment: Report given to RN and Post -op Vital signs reviewed and unstable, Anesthesiologist notified  Post vital signs: Reviewed  Last Vitals:  Vitals:   05/25/16 1038  BP: (!) 145/80  Pulse: 77  Resp: 20  Temp: 36.7 C    Last Pain:  Vitals:   05/25/16 1038  TempSrc: Oral         Complications: No apparent anesthesia complications

## 2016-05-26 ENCOUNTER — Encounter (HOSPITAL_COMMUNITY): Payer: Self-pay | Admitting: Orthopaedic Surgery

## 2016-05-26 LAB — BASIC METABOLIC PANEL
Anion gap: 6 (ref 5–15)
BUN: 17 mg/dL (ref 6–20)
CO2: 25 mmol/L (ref 22–32)
Calcium: 8.2 mg/dL — ABNORMAL LOW (ref 8.9–10.3)
Chloride: 105 mmol/L (ref 101–111)
Creatinine, Ser: 1.16 mg/dL (ref 0.61–1.24)
GFR calc Af Amer: >60 mL/min (ref >60)
GFR calc non Af Amer: >60 mL/min (ref >60)
Glucose, Bld: 141 mg/dL — ABNORMAL HIGH (ref 65–99)
Potassium: 4.1 mmol/L (ref 3.5–5.1)
Sodium: 136 mmol/L (ref 135–145)

## 2016-05-26 LAB — CBC
HCT: 28.6 % — ABNORMAL LOW (ref 39.0–52.0)
Hemoglobin: 9.3 g/dL — ABNORMAL LOW (ref 13.0–17.0)
MCH: 27 pg (ref 26.0–34.0)
MCHC: 32.5 g/dL (ref 30.0–36.0)
MCV: 82.9 fL (ref 78.0–100.0)
Platelets: 176 10*3/uL (ref 150–400)
RBC: 3.45 MIL/uL — ABNORMAL LOW (ref 4.22–5.81)
RDW: 13.9 % (ref 11.5–15.5)
WBC: 10.4 10*3/uL (ref 4.0–10.5)

## 2016-05-26 NOTE — Evaluation (Signed)
Physical Therapy Evaluation Patient Details Name: Jeffrey BuntingDavid Mccormick MRN: [id number] DOB: 1961-01-31 Today's Date: 05/26/2016   History of Present Illness  56 yo male admitted on 05/25/16 for elective right THA. PMH significant for OA, asthma, GERD, Gout, back surgery 2013.   Clinical Impression  Pt is POD 1 and moving well with therapy. Pt initially reports pain at 10/10 with mobility, but this decreases to 5/10 after moving around. Prior to admission, pt was completely independent and working full time for [employer]. Pt lives with his wife in a two story home and will need to ascend and descend 15 steps to get to second floor bedroom, but does have a bathroom on the main level. Pt requires Min A for majority of mobility this session. Pt will benefit from continuing to be seen acutely in order to address the below deficits to assist with smooth transition home.     Follow Up Recommendations Home health PT    Equipment Recommendations  Rolling walker with 5" wheels;3in1 (PT)    Recommendations for Other Services       Precautions / Restrictions Precautions Precautions: None Restrictions Weight Bearing Restrictions: Yes RLE Weight Bearing: Weight bearing as tolerated      Mobility  Bed Mobility Overal bed mobility: Needs Assistance Bed Mobility: Supine to Sit     Supine to sit: HOB elevated;Min assist     General bed mobility comments: Min A for safety. Relies on railings to assist with supine to sit and needs assistance bringing RLE OOB  Transfers Overall transfer level: Needs assistance Equipment used: Rolling walker (2 wheeled) Transfers: Sit to/from Stand Sit to Stand: From elevated surface;Min assist         General transfer comment: Min A for safety and to stabilize once up on RW  Ambulation/Gait Ambulation/Gait assistance: Min assist Ambulation Distance (Feet): 75 Feet Assistive device: Rolling walker (2 wheeled) Gait Pattern/deviations: Step-through  pattern;Decreased stance time - right;Decreased step length - left;Antalgic Gait velocity: decreased Gait velocity interpretation: Below normal speed for age/gender General Gait Details: Mild antalgic gait, good sequencing, cues for upright posture. Requires Min A for safety and for equipment  Stairs            Wheelchair Mobility    Modified Rankin (Stroke Patients Only)       Balance                                             Pertinent Vitals/Pain Pain Assessment: 0-10 Pain Score: 10-Worst pain ever Pain Location: 2 in back and 10/10 in hip with movement Pain Descriptors / Indicators: Sore Pain Intervention(s): Monitored during session;Ice applied;Premedicated before session    Home Living Family/patient expects to be discharged to:: Private residence Living Arrangements: Spouse/significant other Available Help at Discharge: Family;Available 24 hours/day Type of Home: House Home Access: Level entry;Stairs to enter Entrance Stairs-Rails: None Entrance Stairs-Number of Steps: 1 Home Layout: Two level;1/2 bath on main level Home Equipment: Shower seat      Prior Function Level of Independence: Independent with assistive device(s)         Comments: uses sockaid for right LE, works as a Transport plannertire builder at [employer]     Hand Dominance   Dominant Hand: Right    Extremity/Trunk Assessment   Upper Extremity Assessment Upper Extremity Assessment: Defer to OT evaluation    Lower Extremity Assessment Lower Extremity  Assessment: RLE deficits/detail RLE Deficits / Details: Pt with normal post op pain and weakness. At least 3/5 ankle and 2/5 knee and hip per gross functional assessment       Communication   Communication: No difficulties  Cognition Arousal/Alertness: Awake/alert Behavior During Therapy: WFL for tasks assessed/performed Overall Cognitive Status: Within Functional Limits for tasks assessed                       General Comments      Exercises Total Joint Exercises Ankle Circles/Pumps: AROM;Both;20 reps;Supine Quad Sets: AROM;Right;10 reps;Supine Heel Slides: AAROM;Right;10 reps;Supine   Assessment/Plan    PT Assessment Patient needs continued PT services  PT Problem List Decreased strength;Decreased range of motion;Decreased activity tolerance;Decreased balance;Decreased mobility;Decreased knowledge of use of DME;Pain          PT Treatment Interventions DME instruction;Gait training;Stair training;Functional mobility training;Therapeutic activities;Therapeutic exercise;Balance training;Patient/family education    PT Goals (Current goals can be found in the Care Plan section)  Acute Rehab PT Goals Patient Stated Goal: to get home and get up stairs PT Goal Formulation: With patient Time For Goal Achievement: 06/02/16 Potential to Achieve Goals: Good    Frequency 7X/week   Barriers to discharge        Co-evaluation               End of Session Equipment Utilized During Treatment: Gait belt Activity Tolerance: Patient tolerated treatment well Patient left: in chair;with call bell/phone within reach Nurse Communication: Mobility status         Time: 0826-0907 PT Time Calculation (min) (ACUTE ONLY): 41 min   Charges:   PT Evaluation $PT Eval Moderate Complexity: 1 Procedure PT Treatments $Gait Training: 8-22 mins $Therapeutic Exercise: 8-22 mins   PT G Codes:        Colin Broach PT, DPT  [phone]  05/26/2016, 9:14 AM

## 2016-05-26 NOTE — Progress Notes (Signed)
Physical Therapy Treatment Patient Details Name: Jeffrey BuntingDavid Shadoan MRN: [id number] DOB: [birth year] Today's Date: 05/26/2016    History of Present Illness 56 yo male admitted on 05/25/16 for elective right THA. PMH significant for OA, asthma, GERD, Gout, back surgery 2013.     PT Comments    Pt continues to be moving well this PM session. Pt is able to perform LE strengthening exercises with improved quad control and ability to move LE's with decreased therapy assistance. Pt is able to perform increased gait distance this session and a slight decrease in antalgia is noted.    Follow Up Recommendations  Home health PT     Equipment Recommendations  Rolling walker with 5" wheels;3in1 (PT)    Recommendations for Other Services       Precautions / Restrictions Precautions Precautions: None Restrictions Weight Bearing Restrictions: Yes RLE Weight Bearing: Weight bearing as tolerated    Mobility  Bed Mobility Overal bed mobility: Needs Assistance Bed Mobility: Supine to Sit     Supine to sit: HOB elevated;Min guard     General bed mobility comments: Min guard for safety to move RLE EOB  Transfers Overall transfer level: Needs assistance Equipment used: Rolling walker (2 wheeled) Transfers: Sit to/from Stand Sit to Stand: Min guard         General transfer comment: Min guard this session  Ambulation/Gait Ambulation/Gait assistance: Min guard Ambulation Distance (Feet): 250 Feet Assistive device: Rolling walker (2 wheeled) Gait Pattern/deviations: Step-through pattern;Decreased stance time - right;Decreased step length - left;Antalgic Gait velocity: decreased Gait velocity interpretation: Below normal speed for age/gender General Gait Details: Mild antalgic gait, good sequencing, cues for upright posture. Requires Min guard for safety and for equipment   Stairs            Wheelchair Mobility    Modified Rankin (Stroke Patients Only)       Balance                                     Cognition Arousal/Alertness: Awake/alert Behavior During Therapy: WFL for tasks assessed/performed Overall Cognitive Status: Within Functional Limits for tasks assessed                      Exercises Total Joint Exercises Short Arc Quad: AROM;Right;10 reps;Supine Hip ABduction/ADduction: Right;AAROM;10 reps;Supine    General Comments        Pertinent Vitals/Pain Pain Assessment: 0-10 Pain Score: 5  Pain Location: left hip with movement Pain Descriptors / Indicators: Aching;Guarding;Operative site guarding;Sore Pain Intervention(s): Monitored during session;Premedicated before session;Ice applied    Home Living                      Prior Function            PT Goals (current goals can now be found in the care plan section) Acute Rehab PT Goals Patient Stated Goal: to get home and get up stairs Progress towards PT goals: Progressing toward goals    Frequency    7X/week      PT Plan Current plan remains appropriate    Co-evaluation             End of Session Equipment Utilized During Treatment: Gait belt Activity Tolerance: Patient tolerated treatment well Patient left: in chair;with call bell/phone within reach     Time: 1207-1234 PT Time Calculation (min) (ACUTE ONLY): 27 min  Charges:  $Gait Training: 8-22 mins $Therapeutic Exercise: 8-22 mins                    G Codes:      Colin Broach PT, DPT  [phone]  05/26/2016, 1:41 PM

## 2016-05-26 NOTE — Progress Notes (Signed)
Occupational Therapy Evaluation Patient Details Name: Jeffrey BuntingDavid Mccormick MRN: [id number] DOB: Aug 29, 1960 Today's Date: 05/26/2016    History of Present Illness 56 yo male admitted on 05/25/16 for elective right THA. PMH significant for OA, asthma, GERD, Gout, back surgery 2013.    Clinical Impression   Pt presented very pleasant and willing to work with OT. PTA Pt independent in ADL with AE for LB dressing and independent in mobility. Pt currently max assist for ADL. Pt will benefit from skilled OT in the acute setting to maximize safety and independence in ADL prior to dc to venue below. Next session to focus on tub transfer with 3 in 1 (please provide handout) and reinforce AE for LB bathing/dressing (Pt has sock donner and grabber/reacher).    Follow Up Recommendations  No OT follow up;Supervision/Assistance - 24 hour (initially)    Equipment Recommendations  3 in 1 bedside commode    Recommendations for Other Services       Precautions / Restrictions Precautions Precautions: None Precaution Comments: direct anterior Restrictions Weight Bearing Restrictions: Yes RLE Weight Bearing: Weight bearing as tolerated      Mobility Bed Mobility Overal bed mobility: Needs Assistance Bed Mobility: Supine to Sit     Supine to sit: HOB elevated;Min guard     General bed mobility comments: Min guard for safety to move RLE EOB, Pt edcuated in use of sheet/towel/blanket for use as leg lifter  Transfers Overall transfer level: Needs assistance Equipment used: Rolling walker (2 wheeled) Transfers: Sit to/from Stand Sit to Stand: Min guard         General transfer comment: vc for safe hand placement    Balance Overall balance assessment: Needs assistance Sitting-balance support: No upper extremity supported;Feet supported Sitting balance-Leahy Scale: Good Sitting balance - Comments: sitting EOB with no back support   Standing balance support: Bilateral upper extremity  supported;During functional activity Standing balance-Leahy Scale: Poor                              ADL Overall ADL's : Needs assistance/impaired Eating/Feeding: Modified independent;Sitting   Grooming: Wash/dry face;Set up;Sitting Grooming Details (indicate cue type and reason): EOB this session Upper Body Bathing: Set up;Sitting   Lower Body Bathing: Moderate assistance;Sitting/lateral leans Lower Body Bathing Details (indicate cue type and reason): educated Pt and wife in long handle sponge - Pt familiar from back surgery Upper Body Dressing : Set up;Sitting   Lower Body Dressing: Maximal assistance;Sit to/from stand   Toilet Transfer: Min guard;BSC;RW;Ambulation (simulated)           Functional mobility during ADLs: Min guard;Rolling walker General ADL Comments: Pt very motivated to be independent as possible     Vision Vision Assessment?: No apparent visual deficits   Perception     Praxis      Pertinent Vitals/Pain Pain Assessment: 0-10 Pain Score: 5  Pain Location: left hip with movement Pain Descriptors / Indicators: Grimacing;Sore Pain Intervention(s): Limited activity within patient's tolerance;Monitored during session;Ice applied     Hand Dominance Right   Extremity/Trunk Assessment Upper Extremity Assessment Upper Extremity Assessment: Overall WFL for tasks assessed   Lower Extremity Assessment Lower Extremity Assessment: RLE deficits/detail RLE Deficits / Details: Pt with normal post op pain and weakness   Cervical / Trunk Assessment Cervical / Trunk Assessment: Other exceptions Cervical / Trunk Exceptions: history of spinal fusion   Communication Communication Communication: No difficulties   Cognition Arousal/Alertness: Awake/alert Behavior  During Therapy: WFL for tasks assessed/performed Overall Cognitive Status: Within Functional Limits for tasks assessed                     General Comments       Exercises       Shoulder Instructions      Home Living Family/patient expects to be discharged to:: Private residence Living Arrangements: Spouse/significant other Available Help at Discharge: Family;Available 24 hours/day Type of Home: House Home Access: Level entry;Stairs to enter Entrance Stairs-Number of Steps: 1 Entrance Stairs-Rails: None Home Layout: Two level;1/2 bath on main level Alternate Level Stairs-Number of Steps: 15 Alternate Level Stairs-Rails: Right;Left Bathroom Shower/Tub: Tub/shower unit Shower/tub characteristics: Engineer, building services: Standard Bathroom Accessibility: Yes How Accessible: Accessible via walker Home Equipment: Shower seat;Adaptive equipment Adaptive Equipment: Reacher;Sock aid        Prior Functioning/Environment Level of Independence: Independent with assistive device(s)        Comments: uses sockaid for right LE, works as a Transport planner at [employer] List: Decreased strength;Decreased range of motion;Decreased activity tolerance;Impaired balance (sitting and/or standing);Decreased safety awareness;Decreased knowledge of use of DME or AE;Pain   OT Treatment/Interventions: Self-care/ADL training;DME and/or AE instruction;Patient/family education    OT Goals(Current goals can be found in the care plan section) Acute Rehab OT Goals Patient Stated Goal: get back to the golf course OT Goal Formulation: With patient Time For Goal Achievement: 06/02/16 Potential to Achieve Goals: Good ADL Goals Pt Will Perform Lower Body Bathing: with supervision;with adaptive equipment;sitting/lateral leans Pt Will Perform Lower Body Dressing: with supervision;with caregiver independent in assisting;with adaptive equipment;sit to/from stand Pt Will Perform Tub/Shower Transfer: Tub transfer;with caregiver independent in assisting;anterior/posterior transfer;3 in 1;rolling walker  OT Frequency: Min 2X/week   Barriers to D/C:    none        Co-evaluation              End of Session Equipment Utilized During Treatment: Rolling walker Nurse Communication: Mobility status;Weight bearing status  Activity Tolerance: Patient tolerated treatment well Patient left: in bed;with call bell/phone within reach;with family/visitor present   Time: 3154-0086 OT Time Calculation (min): 12 min Charges:  OT General Charges $OT Visit: 1 Procedure OT Evaluation $OT Eval Moderate Complexity: 1 Procedure G-Codes:    Evern Bio Sharniece Gibbon June 13, 2016, 5:16 PM Sherryl Manges OTR/L [phone]

## 2016-05-26 NOTE — Progress Notes (Signed)
   Subjective:  Patient reports pain as mild.  Did well with PT today.  Objective:   VITALS:   Vitals:   05/26/16 0332 05/26/16 0606 05/26/16 0751 05/26/16 1416  BP: (!) 100/43 (!) 98/50 (!) 107/59 137/74  Pulse: 81 84 84 95  Resp: 16 16    Temp: 97.8 F (36.6 C) 97.9 F (36.6 C)  98.7 F (37.1 C)  TempSrc: Oral Oral  Oral  SpO2: 98% 96%  96%  Weight:      Height:        Neurologically intact Neurovascular intact Sensation intact distally Intact pulses distally Dorsiflexion/Plantar flexion intact Incision: dressing C/D/I and no drainage No cellulitis present Compartment soft   Lab Results  Component Value Date   WBC 10.4 05/26/2016   HGB 9.3 (L) 05/26/2016   HCT 28.6 (L) 05/26/2016   MCV 82.9 05/26/2016   PLT 176 05/26/2016     Assessment/Plan:  1 Day Post-Op   - Expected postop acute blood loss anemia - will monitor for symptoms - Up with PT/OT - DVT ppx - SCDs, ambulation, aspirin - WBAT operative extremity - Pain control - Discharge planning - will dc home tomorrow after am PT session  Glee ArvinMichael Staley Lunz 05/26/2016, 6:19 PM [phone]

## 2016-05-27 LAB — CBC
HCT: 28.1 % — ABNORMAL LOW (ref 39.0–52.0)
Hemoglobin: 9.1 g/dL — ABNORMAL LOW (ref 13.0–17.0)
MCH: 27.3 pg (ref 26.0–34.0)
MCHC: 32.4 g/dL (ref 30.0–36.0)
MCV: 84.4 fL (ref 78.0–100.0)
Platelets: 189 10*3/uL (ref 150–400)
RBC: 3.33 MIL/uL — ABNORMAL LOW (ref 4.22–5.81)
RDW: 14.4 % (ref 11.5–15.5)
WBC: 16.7 10*3/uL — ABNORMAL HIGH (ref 4.0–10.5)

## 2016-05-27 NOTE — Care Management Note (Signed)
  56 yo M s/p R THA  Received referral to assist with HHPT and DME.  Met with pt and wife. Pt plans to return home with the support of his wife. He lives in [place], [place]. Discussed preference for a [facility] agency for HHPT. They don't have a preference for an agency. [place] at [phone] and referral accepted by [facility]. H & P, Demographic, HHPT order, PT/OT evaluation, and D/C summary faxed to [facility] at [phone]. Received confirmation that fax was successful. Provided wife with [facility] phone #.  Contacted Reggie at [facility] for DME referral.

## 2016-05-27 NOTE — Progress Notes (Signed)
Subjective: Patient stable.  [name] moving about in the room with more ease than yesterday   Objective: Vital signs in last 24 hours: Temp:  [97.8 F (36.6 C)-98.7 F (37.1 C)] 97.8 F (36.6 C) (01/13 0445) Pulse Rate:  [90-95] 93 (01/13 0445) BP: (124-137)/(74-80) 124/76 (01/13 0445) SpO2:  [96 %-98 %] 98 % (01/13 0445)  Intake/Output from previous day: 01/12 0701 - 01/13 0700 In: 3318.8 [P.O.:1200; I.V.:2118.8] Out: -  Intake/Output this shift: No intake/output data recorded.  Exam:  Sensation intact distally Dorsiflexion/Plantar flexion intact  Labs:  Recent Labs  05/25/16 1633 05/26/16 0604 05/27/16 0554  HGB 10.9* 9.3* 9.1*    Recent Labs  05/26/16 0604 05/27/16 0554  WBC 10.4 16.7*  RBC 3.45* 3.33*  HCT 28.6* 28.1*  PLT 176 189    Recent Labs  05/25/16 1633 05/26/16 0604  NA 139 136  K 4.0 4.1  CL  --  105  CO2  --  25  BUN  --  17  CREATININE  --  1.16  GLUCOSE 124* 141*  CALCIUM  --  8.2*   No results for input(s): LABPT, INR in the last 72 hours.  Assessment/Plan: Plan possible discharge today.  He did have a little nausea last night.  If he does okay with physical therapy this morning and doesn't have nausea than we will plan on discharging him this afternoon.  If not we can observe him for 1 more night and then discharge tomorrow   Burnard BuntingG Scott Makiah Clauson 05/27/2016, 9:22 AM

## 2016-05-27 NOTE — Progress Notes (Signed)
PT Treatment Note  Pt continues to move well with therapy. Improved stair negotiation noted this session. Pt had moderate antalgic gait which will hopefully improve once strength improved in RLE. Pt continues to benefit from skilled PT services upon discharge in order to maximize his functional outcomes.    05/27/16 1245  PT Visit Information  Last PT Received On 05/27/16  Assistance Needed +1  History of Present Illness 56 yo male admitted on 05/25/16 for elective right THA. PMH significant for OA, asthma, GERD, Gout, back surgery 2013.   Subjective Data  Subjective pt states that he continues to be doing well and is ready to go home.   Patient Stated Goal [place] the golf [place]  Precautions  Precautions None  Precaution Comments direct anterior  Restrictions  Weight Bearing Restrictions Yes  RLE Weight Bearing WBAT  Pain Assessment  Pain Assessment 0-10  Pain Score 5  Pain Location left hip with movement  Pain Descriptors / Indicators Grimacing;Sore  Pain Intervention(s) Monitored during session;Ice applied;Premedicated before session;Repositioned  Cognition  Arousal/Alertness Awake/alert  Behavior During Therapy WFL for tasks assessed/performed  Overall Cognitive Status Within Functional Limits for tasks assessed  Bed Mobility  Overal bed mobility Needs Assistance  Bed Mobility Supine to Sit  Supine to sit HOB elevated;Min guard  General bed mobility comments Min guard for safety to move RLE EOB, Pt edcuated in use of sheet/towel/blanket for use as leg lifter  Transfers  Overall transfer level Needs assistance  Equipment used Rolling walker (2 wheeled)  Transfers Sit to/from Stand  Sit to Stand Min guard  General transfer comment vc for safe hand placement  Ambulation/Gait  Ambulation/Gait assistance Min guard  Ambulation Distance (Feet) 350 Feet  Assistive device Rolling walker (2 wheeled)  Gait Pattern/deviations Step-through pattern;Decreased stance time -  right;Decreased step length - left;Antalgic  General Gait Details Moderate antalgic gait noted this session, good sequencing but pt continues to rely on RW to decrease weight bearing through RLE  Gait velocity decreased  Gait velocity interpretation Below normal speed for age/gender  Stairs Yes  Stairs assistance Supervision  Stair Management Two rails;Step to pattern;Forwards  Number of Stairs 13  General stair comments cues for sequencing  PT - End of Session  Equipment Utilized During Treatment Gait belt  Activity Tolerance Patient tolerated treatment well  Patient left in chair;with call bell/phone within reach  Nurse Communication Mobility status  PT - Assessment/Plan  PT Plan Current plan remains appropriate  PT Frequency (ACUTE ONLY) 7X/week  Follow Up Recommendations Home health PT  PT equipment Rolling walker with 5" wheels;3in1 (PT)  PT Time Calculation  PT Start Time (ACUTE ONLY) 1144  PT Stop Time (ACUTE ONLY) 1200  PT Time Calculation (min) (ACUTE ONLY) 16 min  PT General Charges  $$ ACUTE PT VISIT 1 Procedure  PT Treatments  $Gait Training 8-22 mins   Colin BroachSabra M. Aydn Ferrara PT, DPT  [phone]

## 2016-05-27 NOTE — Progress Notes (Signed)
Occupational Therapy Treatment Patient Details Name: Jeffrey Mccormick MRN: [id number] DOB: [birth year] Today's Date: 05/27/2016    History of present illness 56 yo male admitted on 05/25/16 for elective right THA. PMH significant for OA, asthma, GERD, Gout, back surgery 2013.    OT comments  Pt progressing towards OT goals this session. Pt very pleasant and willing to work with therapy. Session focused on tub transfer with 3 in 1, and AE practice. Please see performance level below. Pt at adequate level for dc to venue below. Pt and wife with no further questions or concerns for OT at the end of session.   Follow Up Recommendations  No OT follow up;Supervision/Assistance - 24 hour (initially)    Equipment Recommendations  3 in 1 bedside commode    Recommendations for Other Services      Precautions / Restrictions Precautions Precautions: None Precaution Comments: direct anterior Restrictions Weight Bearing Restrictions: Yes RLE Weight Bearing: Weight bearing as tolerated       Mobility Bed Mobility Overal bed mobility: Needs Assistance Bed Mobility: Supine to Sit     Supine to sit: HOB elevated;Min guard     General bed mobility comments: Pt sitting OOB in recliner upon OT arrival  Transfers Overall transfer level: Needs assistance Equipment used: Rolling walker (2 wheeled) Transfers: Sit to/from Stand Sit to Stand: Supervision         General transfer comment: good hand placement this session    Balance Overall balance assessment: Needs assistance Sitting-balance support: No upper extremity supported;Feet supported Sitting balance-Leahy Scale: Good     Standing balance support: Bilateral upper extremity supported;During functional activity Standing balance-Leahy Scale: Poor Standing balance comment: reliant on RW                   ADL Overall ADL's : Needs assistance/impaired                         Toilet Transfer: Min  guard;BSC;Ambulation;RW       Tub/ Shower Transfer: Tub transfer;Supervision/safety;Anterior/posterior;3 in 1;Rolling walker;Cueing for sequencing Tub/Shower Transfer Details (indicate cue type and reason): Pt practiced in the [facility] and provided with handout Functional mobility during ADLs: Supervision/safety;Rolling walker General ADL Comments: Pt practiced with sock donner, leg lifter, and long handle shoe horn      Vision                     Perception     Praxis      Cognition   Behavior During Therapy: WFL for tasks assessed/performed Overall Cognitive Status: Within Functional Limits for tasks assessed                       Extremity/Trunk Assessment               Exercises    Shoulder Instructions       General Comments      Pertinent Vitals/ Pain       Pain Assessment: 0-10 Pain Score: 5  Pain Location: left hip with movement Pain Descriptors / Indicators: Grimacing;Sore Pain Intervention(s): Monitored during session;Repositioned;Ice applied;Patient requesting pain meds-RN notified  Home Living                                          Prior Functioning/Environment  Frequency  Min 2X/week        Progress Toward Goals  OT Goals(current goals can now be found in the care plan section)  Progress towards OT goals: Progressing toward goals  Acute Rehab OT Goals Patient Stated Goal: get back to the golf course OT Goal Formulation: With patient Time For Goal Achievement: 06/02/16 Potential to Achieve Goals: Good  Plan Discharge plan remains appropriate    Co-evaluation                 End of Session Equipment Utilized During Treatment: Gait belt;Rolling walker   Activity Tolerance Patient tolerated treatment well   Patient Left in chair;with call bell/phone within reach;with nursing/sitter in room;with family/visitor present   Nurse Communication Mobility status;Weight bearing  status;Other (comment);Patient requests pain meds (in room with Pt providing pain meds)        Time: 4132-4401 OT Time Calculation (min): 20 min  Charges: OT General Charges $OT Visit: 1 Procedure OT Treatments $Self Care/Home Management : 8-22 mins  Evern Bio Kaia [place] 05/27/2016, 2:14 PM Sherryl Manges OTR/L [phone]

## 2016-05-27 NOTE — Progress Notes (Signed)
Physical Therapy Treatment Patient Details Name: Jeffrey Mccormick MRN: [id number] DOB: 31-Oct-1960 Today's Date: 05/27/2016    History of Present Illness 56 yo male admitted on 05/25/16 for elective right THA. PMH significant for OA, asthma, GERD, Gout, back surgery 2013.     PT Comments    Pt is POD 2 and moving well with therapy. Pt is able to increase gait distance this session with improved upright posture, but continues to rely on UE's to support himself with gait. Instructed pt on stair negotiation, but he will benefit from additional stair training in second therapy session this afternoon.    Follow Up Recommendations  Home health PT     Equipment Recommendations  Rolling walker with 5" wheels;3in1 (PT)    Recommendations for Other Services       Precautions / Restrictions Precautions Precautions: None Precaution Comments: direct anterior Restrictions Weight Bearing Restrictions: Yes RLE Weight Bearing: Weight bearing as tolerated    Mobility  Bed Mobility Overal bed mobility: Needs Assistance Bed Mobility: Supine to Sit     Supine to sit: HOB elevated;Min guard     General bed mobility comments: Min guard for safety to move RLE EOB, Pt edcuated in use of sheet/towel/blanket for use as leg lifter  Transfers Overall transfer level: Needs assistance Equipment used: Rolling walker (2 wheeled) Transfers: Sit to/from Stand Sit to Stand: Min guard         General transfer comment: vc for safe hand placement  Ambulation/Gait Ambulation/Gait assistance: Min guard Ambulation Distance (Feet): 500 Feet Assistive device: Rolling walker (2 wheeled) Gait Pattern/deviations: Step-through pattern;Decreased stance time - right;Decreased step length - left;Antalgic Gait velocity: decreased Gait velocity interpretation: Below normal speed for age/gender General Gait Details: Moderate antalgic gait noted this session, good sequencing but pt continues to rely on RW to decrease  weight bearing through RLE   Stairs Stairs: Yes   Stair Management: Two rails;Step to pattern;Forwards Number of Stairs: 2 General stair comments: cues for sequencing  Wheelchair Mobility    Modified Rankin (Stroke Patients Only)       Balance                                    Cognition Arousal/Alertness: Awake/alert Behavior During Therapy: WFL for tasks assessed/performed Overall Cognitive Status: Within Functional Limits for tasks assessed                      Exercises Total Joint Exercises Long Arc Quad: AROM;Right;10 reps;Seated    General Comments        Pertinent Vitals/Pain Pain Assessment: 0-10 Pain Score: 6  Pain Location: left hip with movement Pain Descriptors / Indicators: Grimacing;Sore Pain Intervention(s): Monitored during session;Premedicated before session;Ice applied    Home Living                      Prior Function            PT Goals (current goals can now be found in the care plan section) Acute Rehab PT Goals Patient Stated Goal: get back to the golf course Progress towards PT goals: Progressing toward goals    Frequency    7X/week      PT Plan Current plan remains appropriate    Co-evaluation             End of Session Equipment Utilized During Treatment: Gait belt Activity  Tolerance: Patient tolerated treatment well Patient left: in chair;with call bell/phone within reach     Time: 0901-0924 PT Time Calculation (min) (ACUTE ONLY): 23 min  Charges:  $Gait Training: 23-37 mins                    G Codes:      Colin BroachSabra M. Britanni Yarde PT, DPT  [phone]  05/27/2016, 10:19 AM

## 2016-05-28 NOTE — Anesthesia Postprocedure Evaluation (Addendum)
Anesthesia Post Note  Patient: Jeffrey Mccormick  Procedure(s) Performed: Procedure(s) (LRB): RIGHT TOTAL HIP ARTHROPLASTY ANTERIOR APPROACH (Right)  Patient location during evaluation: PACU Anesthesia Type: Spinal Level of consciousness: awake Pain management: pain level controlled Vital Signs Assessment: post-procedure vital signs reviewed and stable Respiratory status: spontaneous breathing Cardiovascular status: stable Postop Assessment: no headache, no backache, spinal receding, patient able to bend at knees and no signs of nausea or vomiting Anesthetic complications: no        Last Vitals:  Vitals:   05/27/16 0445 05/27/16 1245  BP: 124/76 130/84  Pulse: 93 (!) 106  Resp:  16  Temp: 36.6 C 36.6 C    Last Pain:  Vitals:   05/27/16 1410  TempSrc:   PainSc: 1    Pain Goal: Patients Stated Pain Goal: 3 (05/26/16 0225)               Santita Hunsberger JR,JOHN Susann GivensFRANKLIN

## 2016-05-29 ENCOUNTER — Telehealth (INDEPENDENT_AMBULATORY_CARE_PROVIDER_SITE_OTHER): Payer: Self-pay | Admitting: *Deleted

## 2016-05-29 NOTE — Telephone Encounter (Signed)
[facility] called stating she is sending verbal order 2x a week for 4 weeks. She is requesting call back please

## 2016-05-30 NOTE — Telephone Encounter (Signed)
yes

## 2016-05-30 NOTE — Telephone Encounter (Signed)
Is this okay?

## 2016-06-01 NOTE — Discharge Summary (Signed)
Physician Discharge Summary      Patient ID: Jeffrey BuntingDavid Mccormick MRN: [id number] DOB/AGE: [birth year] 56 y.o.  Admit date: 05/25/2016 Discharge date: 06/01/2016  Admission Diagnoses:  <principal problem not specified>  Discharge Diagnoses:  Active Problems:   Hip joint replacement status   Past Medical History:  Diagnosis Date  . Arthritis   . Asthma    Albuterol inhaler as needed. Also has Neb treatment   . GERD (gastroesophageal reflux disease)    takes Protonix daily  . History of blood clots    pt states 20+ yrs ago-superficial  . History of bronchitis 2016  . History of colon polyps    benign  . History of gout   . History of hiatal hernia   . History of hyperlipidemia    was on meds but off for about 6 + yrs  . History of kidney stones   . Hypertension    takes Amlodipine daily  . Joint pain   . Pneumonia    hx of-several yrs ago  . Sleep apnea     Surgeries: Procedure(s): RIGHT TOTAL HIP ARTHROPLASTY ANTERIOR APPROACH on 05/25/2016   Consultants (if any):   Discharged Condition: Improved  Hospital Course: Jeffrey Mccormick is an 56 y.o. male who was admitted 05/25/2016 with a diagnosis of <principal problem not specified> and went to the operating room on 05/25/2016 and underwent the above named procedures.    He was given perioperative antibiotics:  Anti-infectives    Start     Dose/Rate Route Frequency Ordered Stop   05/25/16 2200  ceFAZolin (ANCEF) IVPB 2g/100 mL premix     2 g 200 mL/hr over 30 Minutes Intravenous Every 6 hours 05/25/16 2153 05/26/16 0359   05/25/16 1200  ceFAZolin (ANCEF) 3 g in dextrose 5 % 50 mL IVPB     3 g 130 mL/hr over 30 Minutes Intravenous To Short Stay 05/24/16 0955 05/25/16 1342    .  He was given sequential compression devices, early ambulation, and aspirin for DVT prophylaxis.  He benefited maximally from the hospital stay and there were no complications.    Recent vital signs:  Vitals:   05/27/16 0445 05/27/16 1245  BP:  124/76 130/84  Pulse: 93 (!) 106  Resp:  16  Temp: 97.8 F (36.6 C) 97.9 F (36.6 C)    Recent laboratory studies:  Lab Results  Component Value Date   HGB 9.1 (L) 05/27/2016   HGB 9.3 (L) 05/26/2016   HGB 10.9 (L) 05/25/2016   Lab Results  Component Value Date   WBC 16.7 (H) 05/27/2016   PLT 189 05/27/2016   Lab Results  Component Value Date   INR 0.91 05/23/2016   Lab Results  Component Value Date   NA 136 05/26/2016   K 4.1 05/26/2016   CL 105 05/26/2016   CO2 25 05/26/2016   BUN 17 05/26/2016   CREATININE 1.16 05/26/2016   GLUCOSE 141 (H) 05/26/2016    Discharge Medications:   Allergies as of 05/27/2016   No Known Allergies     Medication List    STOP taking these medications   HYDROcodone-acetaminophen 7.5-325 MG tablet Commonly known as:  NORCO   Ibuprofen-Famotidine 800-26.6 MG Tabs   pantoprazole 20 MG tablet Commonly known as:  PROTONIX   sildenafil 50 MG tablet Commonly known as:  VIAGRA     TAKE these medications   albuterol 108 (90 Base) MCG/ACT inhaler Commonly known as:  PROVENTIL HFA;VENTOLIN HFA Inhale 1-2 puffs into the lungs  every 6 (six) hours as needed for wheezing or shortness of breath.   amLODipine 2.5 MG tablet Commonly known as:  NORVASC Take 2.5 mg by mouth daily.   aspirin EC 325 MG tablet Take 1 tablet (325 mg total) by mouth 2 (two) times daily.   diclofenac sodium 1 % Gel Commonly known as:  VOLTAREN Apply 1 application topically 3 (three) times daily as needed (Hip pain).   methocarbamol 750 MG tablet Commonly known as:  ROBAXIN Take 1 tablet (750 mg total) by mouth 2 (two) times daily as needed for muscle spasms.   ondansetron 4 MG tablet Commonly known as:  ZOFRAN Take 1-2 tablets (4-8 mg total) by mouth every 8 (eight) hours as needed for nausea or vomiting.   oxyCODONE 5 MG immediate release tablet Commonly known as:  Oxy IR/ROXICODONE Take 1-3 tablets (5-15 mg total) by mouth every 4 (four) hours as  needed.   oxyCODONE 10 mg 12 hr tablet Commonly known as:  OXYCONTIN Take 1 tablet (10 mg total) by mouth every 12 (twelve) hours.   promethazine 25 MG tablet Commonly known as:  PHENERGAN Take 1 tablet (25 mg total) by mouth every 6 (six) hours as needed for nausea.   senna-docusate 8.6-50 MG tablet Commonly known as:  SENOKOT S Take 1 tablet by mouth at bedtime as needed.       Diagnostic Studies: Dg Pelvis Portable  Result Date: 05/25/2016 CLINICAL DATA:  Postop day 0 status post right total hip arthroplasty, anterior approach EXAM: PORTABLE PELVIS 1-2 VIEWS COMPARISON:  05/25/2016 FINDINGS: Frontal view of the pelvis contains the entire right hip implant on 2 projections. A right total hip prosthesis is in place without fracture or early complicating feature. Spurring at the pubic symphysis. IMPRESSION: 1. Right total hip prosthesis in place without fracture or early complicating feature. 2. Spurring of the pubic symphysis, stable. Electronically Signed   By: Gaylyn Rong M.D.   On: 05/25/2016 16:38   Dg C-arm 61-120 Min  Result Date: 05/25/2016 CLINICAL DATA:  SURGERY, ELECTIVE EXAM: OPERATIVE RIGHT HIP (WITH PELVIS IF PERFORMED) TECHNIQUE: Fluoroscopic spot image(s) were submitted for interpretation post-operatively. COMPARISON:  No prior . FINDINGS: Total right hip replacement.  Anatomic alignment.  Hardware intact. IMPRESSION: Total right hip replacement.  Anatomic alignment. Electronically Signed   By: Maisie Fus  Register   On: 05/25/2016 15:34   Dg Hip Operative Unilat W Or W/o Pelvis Right  Result Date: 05/25/2016 CLINICAL DATA:  SURGERY, ELECTIVE EXAM: OPERATIVE RIGHT HIP (WITH PELVIS IF PERFORMED) TECHNIQUE: Fluoroscopic spot image(s) were submitted for interpretation post-operatively. COMPARISON:  No prior . FINDINGS: Total right hip replacement.  Anatomic alignment.  Hardware intact. IMPRESSION: Total right hip replacement.  Anatomic alignment. Electronically Signed   By:  Maisie Fus  Register   On: 05/25/2016 15:34    Disposition: 01-Home or Self Care  Discharge Instructions    Call MD / Call 911    Complete by:  As directed    If you experience chest pain or shortness of breath, CALL 911 and be transported to the hospital emergency room.  If you develope a fever above 101 F, pus (white drainage) or increased drainage or redness at the wound, or calf pain, call your surgeon's office.   Constipation Prevention    Complete by:  As directed    Drink plenty of fluids.  Prune juice may be helpful.  You may use a stool softener, such as Colace (over the counter) 100 mg twice a day.  Use MiraLax (over the counter) for constipation as needed.   Diet - low sodium heart healthy    Complete by:  As directed    Increase activity slowly as tolerated    Complete by:  As directed       Follow-up Information    Glee Arvin, MD Follow up in 2 week(s).   Specialty:  Orthopedic Surgery Why:  For suture removal, For wound re-check Contact information: [address] [place] [address] [phone]            Signed: Glee Arvin 06/01/2016, 7:12 PM

## 2016-06-02 NOTE — Telephone Encounter (Signed)
LMOM okay per Dr Xu 

## 2016-06-09 ENCOUNTER — Encounter (INDEPENDENT_AMBULATORY_CARE_PROVIDER_SITE_OTHER): Payer: Self-pay

## 2016-06-09 ENCOUNTER — Encounter (INDEPENDENT_AMBULATORY_CARE_PROVIDER_SITE_OTHER): Payer: Self-pay | Admitting: Orthopaedic Surgery

## 2016-06-09 ENCOUNTER — Ambulatory Visit (INDEPENDENT_AMBULATORY_CARE_PROVIDER_SITE_OTHER): Payer: BLUE CROSS/BLUE SHIELD | Admitting: Orthopaedic Surgery

## 2016-06-09 DIAGNOSIS — M542 Cervicalgia: Secondary | ICD-10-CM

## 2016-06-09 DIAGNOSIS — M1611 Unilateral primary osteoarthritis, right hip: Secondary | ICD-10-CM

## 2016-06-09 MED ORDER — MUPIROCIN 2 % EX OINT: 1.0000 "application " | TOPICAL_OINTMENT | Freq: Two times a day (BID) | CUTANEOUS | 0 refills | Status: DC

## 2016-06-09 MED ORDER — SULFAMETHOXAZOLE-TRIMETHOPRIM 800-160 MG PO TABS: 1.0000 | ORAL_TABLET | Freq: Two times a day (BID) | ORAL | 0 refills | Status: DC

## 2016-06-09 MED ORDER — CALCIUM CARBONATE-VITAMIN D 500-200 MG-UNIT PO TABS: 1.0000 | ORAL_TABLET | Freq: Three times a day (TID) | ORAL | 12 refills | Status: DC

## 2016-06-09 NOTE — Progress Notes (Signed)
Patient is 2 weeks status post right total hip replacement. [name] doing well. Just having some trouble getting comfortable at night. The incision is clean dry and intact except for just a small area at the proximal extent. There is no drainage or signs of infection. I will like him to put Bactroban ointment on this twice a day until it is fully healed. I went to put him on Bactrim for a week.  Outpatient physical therapy referral was made today. I'll see him back in 4 weeks with standing AP pelvis. Aspirin for DVT prophylaxis.

## 2016-07-05 ENCOUNTER — Other Ambulatory Visit (INDEPENDENT_AMBULATORY_CARE_PROVIDER_SITE_OTHER): Payer: Self-pay | Admitting: Orthopaedic Surgery

## 2016-07-05 NOTE — Telephone Encounter (Signed)
Please advise 

## 2016-07-05 NOTE — Telephone Encounter (Signed)
He doesn't need a refill.  He only needs to take aspirin for 6 weeks postop

## 2016-07-10 ENCOUNTER — Encounter (INDEPENDENT_AMBULATORY_CARE_PROVIDER_SITE_OTHER): Payer: Self-pay

## 2016-07-10 ENCOUNTER — Ambulatory Visit (INDEPENDENT_AMBULATORY_CARE_PROVIDER_SITE_OTHER): Payer: BLUE CROSS/BLUE SHIELD | Admitting: Orthopaedic Surgery

## 2016-07-10 ENCOUNTER — Ambulatory Visit (INDEPENDENT_AMBULATORY_CARE_PROVIDER_SITE_OTHER): Payer: Self-pay

## 2016-07-10 ENCOUNTER — Encounter (INDEPENDENT_AMBULATORY_CARE_PROVIDER_SITE_OTHER): Payer: Self-pay | Admitting: Orthopaedic Surgery

## 2016-07-10 DIAGNOSIS — M79671 Pain in right foot: Secondary | ICD-10-CM

## 2016-07-10 DIAGNOSIS — M1611 Unilateral primary osteoarthritis, right hip: Secondary | ICD-10-CM

## 2016-07-10 MED ORDER — PREDNISONE 10 MG (21) PO TBPK: ORAL_TABLET | ORAL | 0 refills | Status: AC

## 2016-07-10 NOTE — Progress Notes (Signed)
Office Visit Note   Patient: Jeffrey BuntingDavid Mccormick           Date of Birth: 11/23/1960           MRN: [id number] Visit Date: 07/10/2016              Requested by: No referring provider defined for this encounter. PCP: No PCP Per Patient   Assessment & Plan: Visit Diagnoses:  1. Primary osteoarthritis of right hip   2. Pain in right foot     Plan: Patient is doing well from his hip replacement. This point can stop his aspirin. He may drive. He may increase activity as tolerated. We'll still plan on keeping him out of work for more physical therapy and work conditioning. I think he is not ready to return to his full duty yet. I will like to see him back in about 5 weeks to see how he is doing. No x-rays needed. For the right foot he likely has an arthritic flare. I will put him on a Sterapred.  Follow-Up Instructions: Return in about 5 weeks (around 08/14/2016).   Orders:  Orders Placed This Encounter  Procedures  . XR Pelvis 1-2 Views  . XR Foot Complete Right   Meds ordered this encounter  Medications  . predniSONE (STERAPRED UNI-PAK 21 TAB) 10 MG (21) TBPK tablet    Sig: Take as directed    Dispense:  21 tablet    Refill:  0      Procedures: No procedures performed   Clinical Data: No additional findings.   Subjective: Chief Complaint  Patient presents with  . Right Foot - Pain  . Right Hip - Follow-up    Patient is 6 weeks status post right total hip replacement doing outpatient physical therapy and doing well. [name] also complaining of a new complaint of right ankle pain. He has had gout in his ankle before. He denies any injuries. [name] progressing appropriately with his hip replacement. [name] due to go back to work first week of April. His right ankle pain radiates across the front of the ankle.    Review of Systems Review of systems negative except for history of present illness  Objective: Vital Signs: There were no vitals taken for this visit.  Physical  Exam  Constitutional: [name] oriented to person, place, and time. He appears well-developed and well-nourished.  Pulmonary/Chest: Effort normal.  Abdominal: Soft.  Neurological: [name] alert and oriented to person, place, and time.  Skin: Skin is warm.  Psychiatric: He has a normal mood and affect. His behavior is normal. Judgment and thought content normal.  Nursing note and vitals reviewed.   Ortho Exam Right hip exam shows well-healed surgical scar without any complications Right ankle exam shows no swelling or warmth. The skin is intact. He has good range of motion of the ankle. Specialty Comments:  No specialty comments available.  Imaging: No results found.   PMFS History: Patient Active Problem List   Diagnosis Date Noted  . 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 Primary osteoarthritis of right hip 04/25/2016   Past Medical History:  Diagnosis Date  . Arthritis   . Asthma    Albuterol inhaler as needed. Also has Neb treatment   . GERD (gastroesophageal reflux disease)    takes Protonix daily  . History of blood clots    pt states 20+ yrs ago-superficial  . History of bronchitis 2016  . History of colon polyps  benign  . History of gout   . History of hiatal hernia   . History of hyperlipidemia    was on meds but off for about 6 + yrs  . History of kidney stones   . Hypertension    takes Amlodipine daily  . Joint pain   . Pneumonia    hx of-several yrs ago  . Sleep apnea     No family history on file.  Past Surgical History:  Procedure Laterality Date  . BACK SURGERY  2013   fusion-at [facility]  . COLONOSCOPY    . ESOPHAGOGASTRODUODENOSCOPY    . LITHOTRIPSY    . SHOULDER SURGERY Left   . TONSILLECTOMY  as a teenager  . TOTAL HIP ARTHROPLASTY Right 05/25/2016   Procedure: RIGHT TOTAL HIP ARTHROPLASTY ANTERIOR APPROACH;  Surgeon: Tarry Kos, MD;  Location: [facility] OR;  Service: Orthopedics;  Laterality: Right;  . UMBILICAL HERNIA REPAIR     Social  History   Occupational History  . Not on file.   Social History Main Topics  . Smoking status: Current Some Day Smoker    Types: Cigars  . Smokeless tobacco: Current User    Types: Snuff  . Alcohol use Yes     Comment: beer  . Drug use: No  . Sexual activity: Yes

## 2016-08-07 ENCOUNTER — Ambulatory Visit (INDEPENDENT_AMBULATORY_CARE_PROVIDER_SITE_OTHER): Payer: BLUE CROSS/BLUE SHIELD | Admitting: Orthopaedic Surgery

## 2016-08-07 ENCOUNTER — Encounter (INDEPENDENT_AMBULATORY_CARE_PROVIDER_SITE_OTHER): Payer: Self-pay | Admitting: Orthopaedic Surgery

## 2016-08-07 DIAGNOSIS — M1611 Unilateral primary osteoarthritis, right hip: Secondary | ICD-10-CM

## 2016-08-07 NOTE — Progress Notes (Signed)
3 month THA follow up plan  Patient now 3 months status post total hip arthroplasty. Wound is healed with no signs of complications or infection.  The patient does not complain of pain, and is back to normal daily activities.  It was reinforced that prophylactic antibiotics should be taken with any procedure including but not limited to dental work or colonoscopies.  We will plan on following up at the 6 month postop visit with radiographs at that time. As always, instructions were given to call with any questions or concerns in the interim.  From my standpoint he can discontinue outpatient physical therapy and return to work on April 4.

## 2016-10-20 NOTE — Addendum Note (Signed)
Addendum  created 10/20/16 0916 by Felicidad Sugarman, MD   Sign clinical note    

## 2016-11-06 ENCOUNTER — Ambulatory Visit (INDEPENDENT_AMBULATORY_CARE_PROVIDER_SITE_OTHER): Payer: Self-pay

## 2016-11-06 ENCOUNTER — Encounter (INDEPENDENT_AMBULATORY_CARE_PROVIDER_SITE_OTHER): Payer: Self-pay | Admitting: Orthopaedic Surgery

## 2016-11-06 ENCOUNTER — Ambulatory Visit (INDEPENDENT_AMBULATORY_CARE_PROVIDER_SITE_OTHER): Payer: BLUE CROSS/BLUE SHIELD | Admitting: Orthopaedic Surgery

## 2016-11-06 ENCOUNTER — Ambulatory Visit (INDEPENDENT_AMBULATORY_CARE_PROVIDER_SITE_OTHER): Payer: BLUE CROSS/BLUE SHIELD

## 2016-11-06 DIAGNOSIS — M79642 Pain in left hand: Secondary | ICD-10-CM

## 2016-11-06 DIAGNOSIS — M1611 Unilateral primary osteoarthritis, right hip: Secondary | ICD-10-CM | POA: Diagnosis not present

## 2016-11-06 DIAGNOSIS — M25552 Pain in left hip: Secondary | ICD-10-CM | POA: Diagnosis not present

## 2016-11-06 DIAGNOSIS — M79641 Pain in right hand: Secondary | ICD-10-CM

## 2016-11-06 MED ORDER — MELOXICAM 7.5 MG PO TABS: 15.0000 mg | ORAL_TABLET | Freq: Every day | ORAL | 2 refills | Status: DC | PRN

## 2016-11-06 MED ORDER — BUPIVACAINE HCL 0.5 % IJ SOLN
3.0000 mL | INTRAMUSCULAR | Status: AC | PRN
  Administered 2016-11-06: 3 mL via INTRA_ARTICULAR

## 2016-11-06 MED ORDER — TRIAMCINOLONE ACETONIDE 40 MG/ML IJ SUSP
80.0000 mg | INTRAMUSCULAR | Status: AC | PRN
  Administered 2016-11-06: 80 mg via INTRA_ARTICULAR

## 2016-11-06 NOTE — Addendum Note (Signed)
Addended by: Cherre HugerMAY, Hanley Rispoli E on: 11/06/2016 03:25 PM   Modules accepted: Orders

## 2016-11-06 NOTE — Progress Notes (Signed)
Office Visit Note   Patient: Jeffrey BuntingDavid Bosso           Date of Birth: Feb 27, 1961           MRN: [id number] Visit Date: 11/06/2016              Requested by: No referring provider defined for this encounter. PCP: Patient, No Pcp Per   Assessment & Plan: Visit Diagnoses:  1. Primary osteoarthritis of right hip   2. Hand pain, left   3. Pain in right hand   4. Left hip pain     Plan: Overall impression is bilateral hand arthritis versus gout versus inflammatory arthritis. Mobic was prescribed for this. Arthritis panel obtained today. Left hip injection was ordered for Dr. Alvester MorinNewton. He will need standing AP pelvis on return.  Follow-Up Instructions: Return in about 6 months (around 05/08/2017).   Orders:  Orders Placed This Encounter  Procedures  . XR Pelvis 1-2 Views  . XR HIP UNILAT W OR W/O PELVIS 2-3 VIEWS LEFT  . XR Hand Complete Left  . Ambulatory referral to Physical Medicine Rehab   Meds ordered this encounter  Medications  . meloxicam (MOBIC) 7.5 MG tablet    Sig: Take 2 tablets (15 mg total) by mouth daily as needed for pain.    Dispense:  30 tablet    Refill:  2      Procedures: No procedures performed   Clinical Data: No additional findings.   Subjective: Chief Complaint  Patient presents with  . Right Hip - Routine Post Op  . Left Hip - Pain  . Left Hand - Pain  . Right Hand - Pain    Jeffrey HuaDavid is 6 months status post right total hip replacement. [name] doing well. [name] happy he had replaced. He's been playing golf recently. He does have history of gout. [name] complaining of bilateral hand pain and finger pain with swelling. His left hip is starting to bother him and its reminiscent of his right hip pain.    Review of Systems  Constitutional: Negative.   All other systems reviewed and are negative.    Objective: Vital Signs: There were no vitals taken for this visit.  Physical Exam  Constitutional: [name] oriented to person, place, and time. He  appears well-developed and well-nourished.  Pulmonary/Chest: Effort normal.  Abdominal: Soft.  Neurological: [name] alert and oriented to person, place, and time.  Skin: Skin is warm.  Psychiatric: He has a normal mood and affect. His behavior is normal. Judgment and thought content normal.  Nursing note and vitals reviewed.   Ortho Exam Follow hand exam shows tender interphalangeal and metacarpal joints. No significant swelling or redness. Left hip exam is positive for Stinchfield sign. He has good range of motion. Lateral hip is nontender. Specialty Comments:  No specialty comments available.  Imaging: Xr Hip Unilat W Or W/o Pelvis 2-3 Views Left  Result Date: 11/06/2016 Moderate degenerative joint disease with joint space narrowing  Xr Hand Complete Left  Result Date: 11/06/2016 No significant osteoarthritis  Xr Pelvis 1-2 Views  Result Date: 11/06/2016 Stable right total hip replacement    PMFS History: Patient Active Problem List   Diagnosis Date Noted  . 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 Primary osteoarthritis of right hip 04/25/2016   Past Medical History:  Diagnosis Date  . Arthritis   . Asthma    Albuterol inhaler as needed. Also has Neb treatment   . GERD (  gastroesophageal reflux disease)    takes Protonix daily  . History of blood clots    pt states 20+ yrs ago-superficial  . History of bronchitis 2016  . History of colon polyps    benign  . History of gout   . History of hiatal hernia   . History of hyperlipidemia    was on meds but off for about 6 + yrs  . History of kidney stones   . Hypertension    takes Amlodipine daily  . Joint pain   . Pneumonia    hx of-several yrs ago  . Sleep apnea     No family history on file.  Past Surgical History:  Procedure Laterality Date  . BACK SURGERY  2013   fusion-at [facility]  . COLONOSCOPY    . ESOPHAGOGASTRODUODENOSCOPY    . LITHOTRIPSY    . SHOULDER SURGERY Left   . TONSILLECTOMY  as a  teenager  . TOTAL HIP ARTHROPLASTY Right 05/25/2016   Procedure: RIGHT TOTAL HIP ARTHROPLASTY ANTERIOR APPROACH;  Surgeon: Tarry Kos, MD;  Location: [facility] OR;  Service: Orthopedics;  Laterality: Right;  . UMBILICAL HERNIA REPAIR     Social History   Occupational History  . Not on file.   Social History Main Topics  . Smoking status: Current Some Day Smoker    Types: Cigars  . Smokeless tobacco: Current User    Types: Snuff  . Alcohol use Yes     Comment: beer  . Drug use: No  . Sexual activity: Yes

## 2016-11-06 NOTE — Addendum Note (Signed)
Addended by: Ashok NorrisNEWTON, Aceyn Kathol K on: 11/06/2016 02:39 PM   Modules accepted: Orders

## 2016-11-06 NOTE — Progress Notes (Signed)
Jeffrey Mccormick - 56 y.o. male MRN [id number]  Date of birth: November 13, 1960  Office Visit Note: Visit Date: 11/06/2016 PCP: Patient, No Pcp Per Referred by: No ref. provider found  Subjective: Chief Complaint  Patient presents with  . Right Hip - Routine Post Op  . Left Hip - Pain  . Left Hand - Pain  . Right Hand - Pain   HPI: Patient is a 56 year old gentleman with chronic worsening left hip pain. [name] status post right hip surgery. He saw Dr. Roda Shutters today in follow-up in Dr. Roda Shutters requested diagnostic and hopefully therapeutic intra-articular hip injection. Because the patient does drive from [place] will complete the injection today.    ROS Otherwise per HPI.  Assessment & Plan: Visit Diagnoses:  1. Primary osteoarthritis of right hip   2. Hand pain, left   3. Pain in right hand   4. Left hip pain     Plan: Findings:  Left intra-articular hip injection with fluoroscopic guidance. The patient is having some mild hip and groin pain at this point and they did seem to be somewhat relieved during the anesthetic phase of the injection. He has most of his pain with standing and ambulating.     Meds & Orders:  Meds ordered this encounter  Medications  . meloxicam (MOBIC) 7.5 MG tablet    Sig: Take 2 tablets (15 mg total) by mouth daily as needed for pain.    Dispense:  30 tablet    Refill:  2    Orders Placed This Encounter  Procedures  . Large Joint Injection/Arthrocentesis  . XR Pelvis 1-2 Views  . XR HIP UNILAT W OR W/O PELVIS 2-3 VIEWS LEFT  . XR Hand Complete Left  . XR C-ARM NO REPORT  . Ambulatory referral to Physical Medicine Rehab    Follow-up: Return in about 6 months (around 05/08/2017).   Procedures: Hip intra-articular injection fluoroscopic guidance Date/Time: 11/06/2016 2:27 PM Performed by: Tyrell Antonio Authorized by: Tyrell Antonio   Consent Given by:  Patient Site marked: the procedure site was marked   Timeout: prior to procedure the correct  patient, procedure, and site was verified   Indications:  Pain and diagnostic evaluation Location:  Hip Site:  L hip joint Prep: patient was prepped and draped in usual sterile fashion   Needle Size:  22 G Needle Length:  3.5 inches Approach:  Anterior Ultrasound Guidance: No   Fluoroscopic Guidance: Yes   Arthrogram: No   Medications:  80 mg triamcinolone acetonide 40 MG/ML; 3 mL bupivacaine 0.5 % Aspiration Attempted: Yes   Patient tolerance:  Patient tolerated the procedure well with no immediate complications  There was excellent flow of contrast producing a partial arthrogram of the hip. The patient did have some relief of symptoms during the anesthetic phase of the injection. His symptoms are usually worse with standing and working for a long time.    No notes on file   Clinical History: No specialty comments available.  He reports that he has been smoking Cigars.  His smokeless tobacco use includes Snuff. No results for input(s): HGBA1C, LABURIC in the last 8760 hours.  Objective:  VS:  HT:    WT:   BMI:     BP:   HR: bpm  TEMP: ( )  RESP:  Physical Exam  Ortho Exam Imaging: Xr Hip Unilat W Or W/o Pelvis 2-3 Views Left  Result Date: 11/06/2016 Moderate degenerative joint disease with joint space narrowing  Xr Hand  Complete Left  Result Date: 11/06/2016 No significant osteoarthritis  Xr Pelvis 1-2 Views  Result Date: 11/06/2016 Stable right total hip replacement   Past Medical/Family/Surgical/Social History: Medications & Allergies reviewed per EMR Patient Active Problem List   Diagnosis Date Noted  . 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 Primary osteoarthritis of right hip 04/25/2016   Past Medical History:  Diagnosis Date  . Arthritis   . Asthma    Albuterol inhaler as needed. Also has Neb treatment   . GERD (gastroesophageal reflux disease)    takes Protonix daily  . History of blood clots    pt states 20+ yrs ago-superficial  . History of  bronchitis 2016  . History of colon polyps    benign  . History of gout   . History of hiatal hernia   . History of hyperlipidemia    was on meds but off for about 6 + yrs  . History of kidney stones   . Hypertension    takes Amlodipine daily  . Joint pain   . Pneumonia    hx of-several yrs ago  . Sleep apnea    No family history on file. Past Surgical History:  Procedure Laterality Date  . BACK SURGERY  2013   fusion-at [facility]  . COLONOSCOPY    . ESOPHAGOGASTRODUODENOSCOPY    . LITHOTRIPSY    . SHOULDER SURGERY Left   . TONSILLECTOMY  as a teenager  . TOTAL HIP ARTHROPLASTY Right 05/25/2016   Procedure: RIGHT TOTAL HIP ARTHROPLASTY ANTERIOR APPROACH;  Surgeon: Tarry KosNaiping M Xu, MD;  Location: [facility] OR;  Service: Orthopedics;  Laterality: Right;  . UMBILICAL HERNIA REPAIR     Social History   Occupational History  . Not on file.   Social History Main Topics  . Smoking status: Current Some Day Smoker    Types: Cigars  . Smokeless tobacco: Current User    Types: Snuff  . Alcohol use Yes     Comment: beer  . Drug use: No  . Sexual activity: Yes

## 2016-11-06 NOTE — Addendum Note (Signed)
Addended byPrescott Parma: Dorsie Sethi on: 11/06/2016 04:24 PM   Modules accepted: Orders

## 2016-11-07 ENCOUNTER — Ambulatory Visit (INDEPENDENT_AMBULATORY_CARE_PROVIDER_SITE_OTHER): Payer: BLUE CROSS/BLUE SHIELD | Admitting: Orthopaedic Surgery

## 2016-11-07 ENCOUNTER — Other Ambulatory Visit (INDEPENDENT_AMBULATORY_CARE_PROVIDER_SITE_OTHER): Payer: Self-pay | Admitting: Orthopaedic Surgery

## 2016-11-07 LAB — RHEUMATOID FACTOR: Rhuematoid fact SerPl-aCnc: <14 IU/mL (ref <14)

## 2016-11-07 LAB — URIC ACID: Uric Acid, Serum: 6.9 mg/dL (ref 4.0–8.0)

## 2016-11-07 LAB — SEDIMENTATION RATE: Sed Rate: 1 mm/hr (ref 0–20)

## 2016-11-07 LAB — ANA: Anti Nuclear Antibody(ANA): NEGATIVE

## 2016-11-07 NOTE — Progress Notes (Signed)
Please let him know that his blood work is normal

## 2016-11-08 NOTE — Telephone Encounter (Signed)
Ok to rf? 

## 2017-05-03 IMAGING — RF DG HIP (WITH PELVIS) OPERATIVE*R*
1 series · 3 of 3 positions shown · non-contrast
Comparison: No prior .

CLINICAL DATA: SURGERY, ELECTIVE

EXAM:
OPERATIVE RIGHT HIP (WITH PELVIS IF PERFORMED)
TECHNIQUE: Fluoroscopic spot image(s) were submitted for interpretation
post-operatively.

[Series 1: run · 3 of 3 slices shown]
[im 1/3]
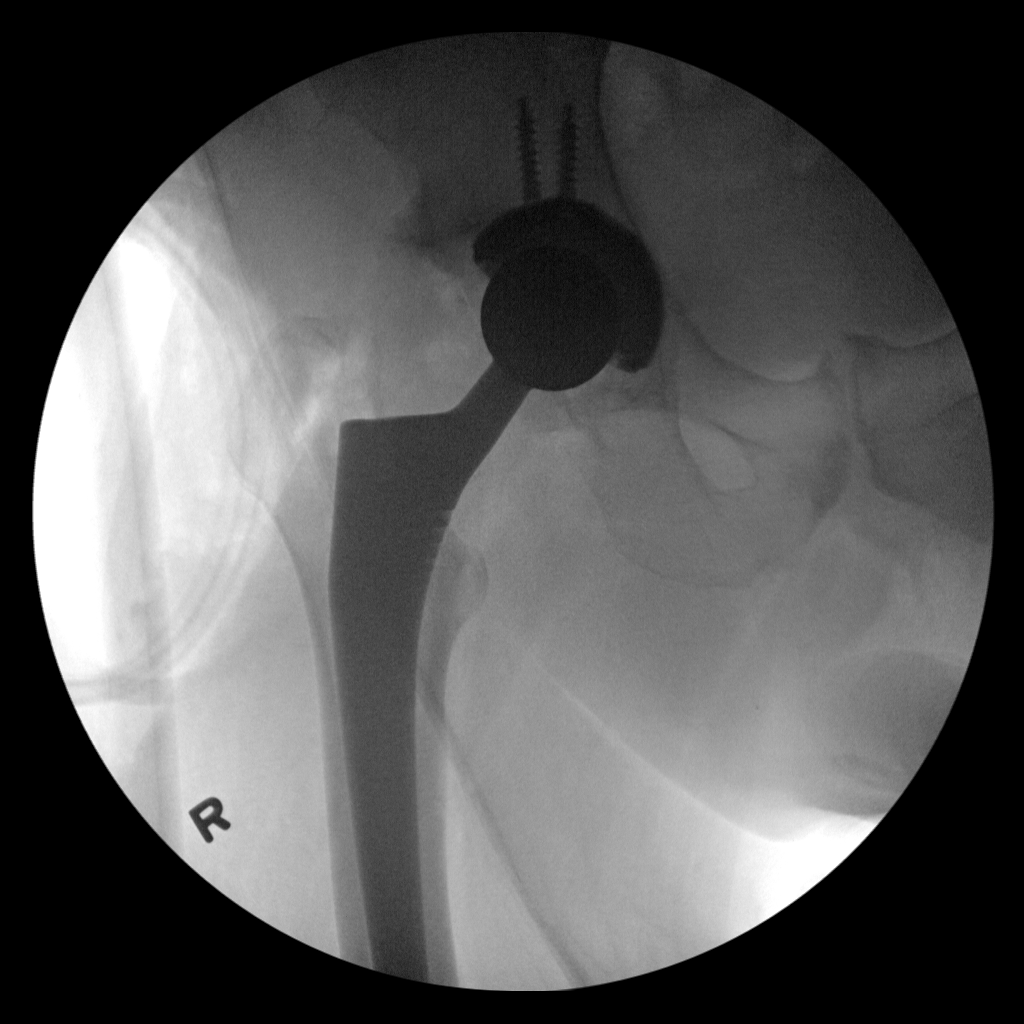
[im 2/3]
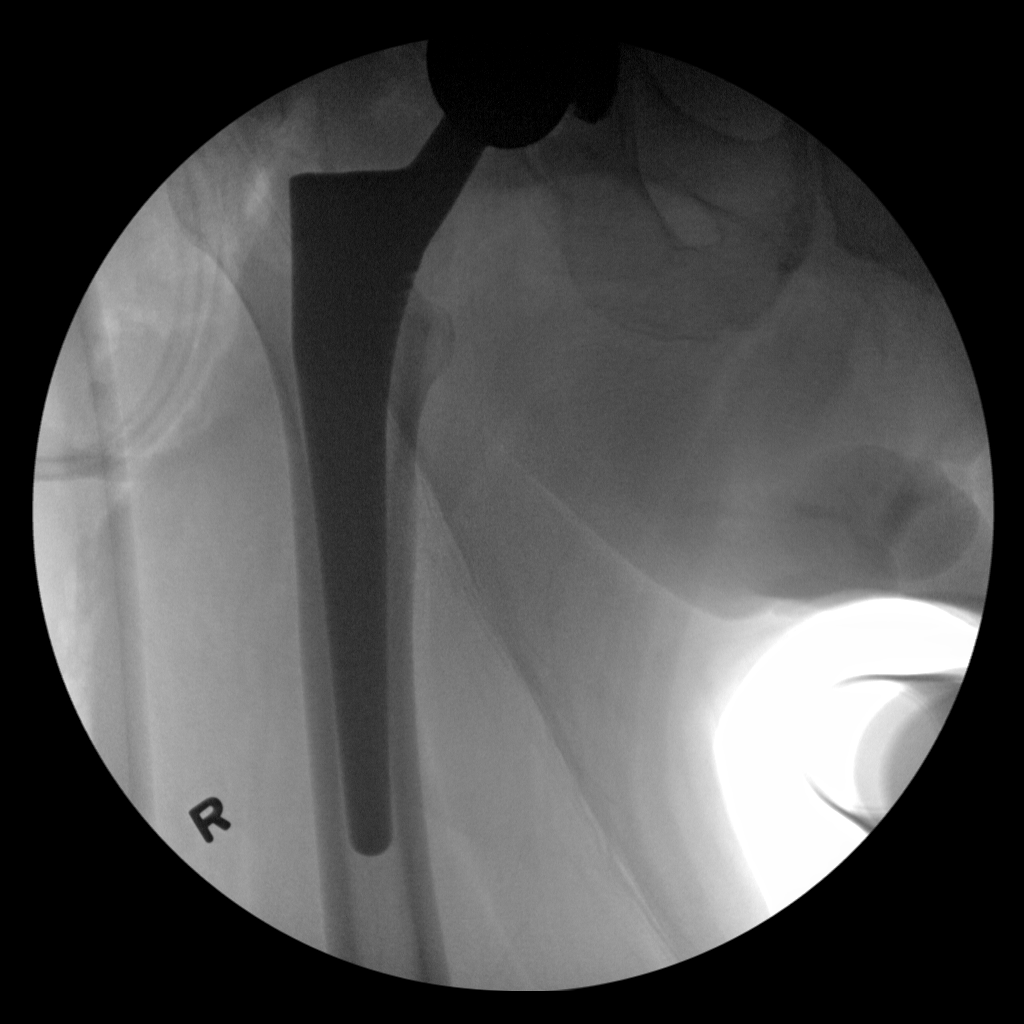
[im 3/3]
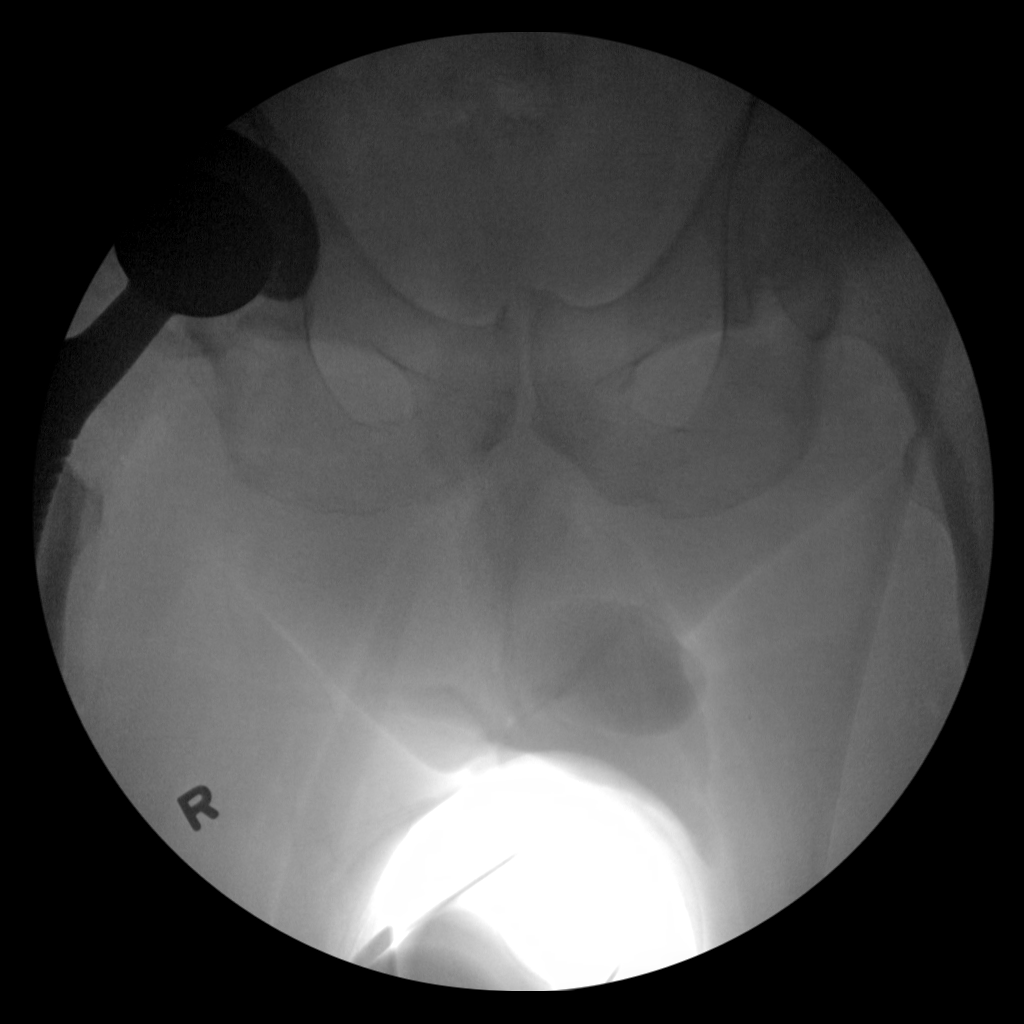

[3 of 3 positions shown; findings below may reference images not displayed]

FINDINGS: Total right hip replacement.  Anatomic alignment.  Hardware intact.
IMPRESSION: Total right hip replacement.  Anatomic alignment.

## 2017-05-21 ENCOUNTER — Ambulatory Visit (INDEPENDENT_AMBULATORY_CARE_PROVIDER_SITE_OTHER): Payer: Self-pay

## 2017-05-21 ENCOUNTER — Encounter (INDEPENDENT_AMBULATORY_CARE_PROVIDER_SITE_OTHER): Payer: Self-pay | Admitting: Orthopaedic Surgery

## 2017-05-21 ENCOUNTER — Ambulatory Visit (INDEPENDENT_AMBULATORY_CARE_PROVIDER_SITE_OTHER): Payer: BLUE CROSS/BLUE SHIELD | Admitting: Orthopaedic Surgery

## 2017-05-21 ENCOUNTER — Ambulatory Visit (INDEPENDENT_AMBULATORY_CARE_PROVIDER_SITE_OTHER): Payer: BLUE CROSS/BLUE SHIELD

## 2017-05-21 DIAGNOSIS — M25552 Pain in left hip: Secondary | ICD-10-CM

## 2017-05-21 DIAGNOSIS — M25551 Pain in right hip: Secondary | ICD-10-CM | POA: Diagnosis not present

## 2017-05-21 DIAGNOSIS — M25572 Pain in left ankle and joints of left foot: Secondary | ICD-10-CM | POA: Diagnosis not present

## 2017-05-21 NOTE — Progress Notes (Signed)
Office Visit Note   Patient: Jeffrey BuntingDavid Mccormick           Date of Birth: 09/08/1960           MRN: [id number] Visit Date: 05/21/2017              Requested by: No referring provider defined for this encounter. PCP: Patient, No Pcp Per   Assessment & Plan: Visit Diagnoses:  1. Bilateral hip pain   2. 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  Right hip replacement standpoint is doing well.  I reminded him of dental prophylaxis for the next year.  Follow-up in 1 year with standing AP pelvis and lateral right hip.  From the left hip standpoint referral to physical therapy for home exercises and stretching of the IT band.  For the left ankle also recommend ankle exercises with physical therapy.  ASO brace was provided today.  Questions encouraged and answered.  Follow-Up Instructions: Return in about 1 year (around 05/21/2018).   Orders:  Orders Placed This Encounter  Procedures  . XR HIP UNILAT W OR W/O PELVIS 2-3 VIEWS LEFT  . XR HIP UNILAT W OR W/O PELVIS 2-3 VIEWS RIGHT  . XR Ankle Complete Left   No orders of the defined types were placed in this encounter.     Procedures: No procedures performed   Clinical Data: No additional findings.   Subjective: Chief Complaint  Patient presents with  . Right Hip - Follow-up  . Left Hip - Pain  . Left Ankle - Pain    Patient is one year status post right placement.  [name] doing well from that standpoint.  [name] complaining more of lateral left hip pain that radiates down into the lateral aspect of the knee.  [name] also endorsing occasional giving way and pain on the lateral aspect of the left ankle.  Denies any numbness and tingling or radicular symptoms.  Denies any injuries.  The hip and ankle pain worse with activity.    Review of Systems    Constitutional: Negative.   All other systems reviewed and are negative.    Objective: Vital Signs: There were no vitals taken for this visit.  Physical Exam  Constitutional: [name] oriented to person, place, and time. He appears well-developed and well-nourished.  Pulmonary/Chest: Effort normal.  Abdominal: Soft.  Neurological: [name] alert and oriented to person, place, and time.  Skin: Skin is warm.  Psychiatric: He has a normal mood and affect. His behavior is normal. Judgment and thought content normal.  Nursing note and vitals reviewed.   Ortho Exam Left hip exam shows painless range of motion.  Lateral hip is slightly tender to palpation.  Negative straight leg.  Negative Stinchfield.  Right hip exam shows a fully healed surgical scar.  Unremarkable exam.  Left ankle exam shows mild swelling.  Mild tenderness over the distal fibula.  Ankle joint is stable. Specialty Comments:  No specialty comments available.  Imaging: Xr Ankle Complete Left  Result Date: 05/21/2017 Mild arthritis with spurring of the distal fibula and medial malleolus.  Overall joint space is well-preserved  Xr Hip Unilat W Or W/o Pelvis 2-3 Views Left  Result Date: 05/21/2017 Mild joint space narrowing of the left hip joint  Xr Hip Unilat  W Or W/o Pelvis 2-3 Views Right  Result Date: 05/21/2017 Stable right total hip replacement in good alignment    PMFS History: Patient Active Problem List   Diagnosis Date Noted  . 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 Primary osteoarthritis of right hip 04/25/2016   Past Medical History:  Diagnosis Date  . Arthritis   . Asthma    Albuterol inhaler as needed. Also has Neb treatment   . GERD (gastroesophageal reflux disease)    takes Protonix daily  . History of blood clots    pt states 20+ yrs ago-superficial  . History of bronchitis 2016  . History of colon polyps    benign  . History of gout   . History of hiatal hernia   . History of  hyperlipidemia    was on meds but off for about 6 + yrs  . History of kidney stones   . Hypertension    takes Amlodipine daily  . Joint pain   . Pneumonia    hx of-several yrs ago  . Sleep apnea     History reviewed. No pertinent family history.  Past Surgical History:  Procedure Laterality Date  . BACK SURGERY  2013   fusion-at [facility]  . COLONOSCOPY    . ESOPHAGOGASTRODUODENOSCOPY    . LITHOTRIPSY    . SHOULDER SURGERY Left   . TONSILLECTOMY  as a teenager  . TOTAL HIP ARTHROPLASTY Right 05/25/2016   Procedure: RIGHT TOTAL HIP ARTHROPLASTY ANTERIOR APPROACH;  Surgeon: Tarry Kos, MD;  Location: [facility] OR;  Service: Orthopedics;  Laterality: Right;  . UMBILICAL HERNIA REPAIR     Social History   Occupational History  . Not on file  Tobacco Use  . Smoking status: Current Some Day Smoker    Types: Cigars  . Smokeless tobacco: Current User    Types: Snuff  Substance and Sexual Activity  . Alcohol use: Yes    Comment: beer  . Drug use: No  . Sexual activity: Yes

## 2017-08-23 ENCOUNTER — Ambulatory Visit (INDEPENDENT_AMBULATORY_CARE_PROVIDER_SITE_OTHER): Payer: BLUE CROSS/BLUE SHIELD

## 2017-08-23 ENCOUNTER — Ambulatory Visit (INDEPENDENT_AMBULATORY_CARE_PROVIDER_SITE_OTHER): Payer: BLUE CROSS/BLUE SHIELD | Admitting: Orthopaedic Surgery

## 2017-08-23 ENCOUNTER — Encounter (INDEPENDENT_AMBULATORY_CARE_PROVIDER_SITE_OTHER): Payer: Self-pay | Admitting: Orthopaedic Surgery

## 2017-08-23 DIAGNOSIS — M25562 Pain in left knee: Secondary | ICD-10-CM | POA: Diagnosis not present

## 2017-08-23 DIAGNOSIS — M25561 Pain in right knee: Secondary | ICD-10-CM | POA: Diagnosis not present

## 2017-08-23 DIAGNOSIS — M25551 Pain in right hip: Secondary | ICD-10-CM | POA: Diagnosis not present

## 2017-08-23 DIAGNOSIS — G8929 Other chronic pain: Secondary | ICD-10-CM | POA: Insufficient documentation

## 2017-08-23 MED ORDER — MELOXICAM 7.5 MG PO TABS: 7.5000 mg | ORAL_TABLET | Freq: Every day | ORAL | 1 refills | Status: DC | PRN

## 2017-08-23 NOTE — Progress Notes (Signed)
Office Visit Note   Patient: Jeffrey Mccormick           Date of Birth: 01/28/1961           MRN: [id number] Visit Date: 08/23/2017              Requested by: No referring provider defined for this encounter. PCP: Patient, No Pcp Per   Assessment & Plan: Visit Diagnoses:  1. Acute pain of left knee   2. Acute pain of right knee   3. Pain in right hip     Plan: Impression is right knee acute medial meniscus tear.  At this point, we will go ahead and obtain an MRI to assess this.  He will follow-up with [place] once that is completed.  He will call if concerns or questions in the meantime.  Follow-Up Instructions: Return for after MRI.   Orders:  Orders Placed This Encounter  Procedures  . XR KNEE 3 VIEW RIGHT  . XR Pelvis 1-2 Views   No orders of the defined types were placed in this encounter.     Procedures: No procedures performed   Clinical Data: No additional findings.   Subjective: Chief Complaint  Patient presents with  . Right Knee - Pain    HPI patient is a pleasant 57 year old gentleman who presents our clinic today with right knee pain.  This began approximately 2 weeks ago when he was fishing out a golf ball from the [facility] when he stepped in quicksand twisting his knee and falling forward on his hands.  Since then he has had marked pain medial aspect of his knee.  He initially had some pain to the medial thigh but this has resolved.  The pain he has to his knee is constant in nature worse when he goes from a seated to standing position, going up hills and at the end of the day after he has been standing on concrete floors where he works at [employer].  No mechanical symptoms.  He has been wearing a brace and utilizing ice and ibuprofen with minimal relief of symptoms.  No numbness tingling burning.  No previous injury to the right knee.  Of note, he is status post right total hip replacement 05/25/2016.  Review of Systems as detailed in HPI.  All others reviewed and  are negative.   Objective: Vital Signs: There were no vitals taken for this visit.  Physical Exam well-developed well-nourished gentleman in no acute distress.  Alert and oriented x3.  Ortho Exam examination of the right knee shows range of motion from 0-100 degrees.  Trace effusion.  Minimal patellofemoral crepitus.  Marked tenderness over the medial joint line with positive medial McMurray.  [name] stable to valgus and varus stress.  Negative logroll negative straight leg raise.  [name] neurovascularly intact distally.  Specialty Comments:  No specialty comments available.  Imaging: Xr Knee 3 View Right  Result Date: 08/23/2017 X-rays of the right knee show moderate joint space narrowing medial compartment.  No other acute findings.  Xr Pelvis 1-2 Views  Result Date: 08/23/2017 X-rays are negative for structural abnormalities.  X-rays show a well-seated prosthesis without evidence of subsidence or osteolysis    PMFS History: Patient Active Problem List   Diagnosis Date Noted  . Acute pain of right knee 08/23/2017  . Pain in right hip 08/23/2017  . 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 Primary osteoarthritis of right hip 04/25/2016   Past Medical History:  Diagnosis Date  .  Arthritis   . Asthma    Albuterol inhaler as needed. Also has Neb treatment   . GERD (gastroesophageal reflux disease)    takes Protonix daily  . History of blood clots    pt states 20+ yrs ago-superficial  . History of bronchitis 2016  . History of colon polyps    benign  . History of gout   . History of hiatal hernia   . History of hyperlipidemia    was on meds but off for about 6 + yrs  . History of kidney stones   . Hypertension    takes Amlodipine daily  . Joint pain   . Pneumonia    hx of-several yrs ago  . Sleep apnea     History reviewed. No pertinent family history.  Past Surgical History:  Procedure Laterality Date  . BACK SURGERY  2013   fusion-at [facility]  . COLONOSCOPY      . ESOPHAGOGASTRODUODENOSCOPY    . LITHOTRIPSY    . SHOULDER SURGERY Left   . TONSILLECTOMY  as a teenager  . TOTAL HIP ARTHROPLASTY Right 05/25/2016   Procedure: RIGHT TOTAL HIP ARTHROPLASTY ANTERIOR APPROACH;  Surgeon: Tarry KosNaiping M Xu, MD;  Location: [facility] OR;  Service: Orthopedics;  Laterality: Right;  . UMBILICAL HERNIA REPAIR     Social History   Occupational History  . Not on file  Tobacco Use  . Smoking status: Current Some Day Smoker    Types: Cigars  . Smokeless tobacco: Current User    Types: Snuff  Substance and Sexual Activity  . Alcohol use: Yes    Comment: beer  . Drug use: No  . Sexual activity: Yes

## 2017-09-06 ENCOUNTER — Telehealth (INDEPENDENT_AMBULATORY_CARE_PROVIDER_SITE_OTHER): Payer: Self-pay

## 2017-09-06 ENCOUNTER — Encounter (INDEPENDENT_AMBULATORY_CARE_PROVIDER_SITE_OTHER): Payer: Self-pay | Admitting: Physician Assistant

## 2017-09-06 ENCOUNTER — Ambulatory Visit (INDEPENDENT_AMBULATORY_CARE_PROVIDER_SITE_OTHER): Payer: BLUE CROSS/BLUE SHIELD | Admitting: Physician Assistant

## 2017-09-06 DIAGNOSIS — S83281A Other tear of lateral meniscus, current injury, right knee, initial encounter: Secondary | ICD-10-CM

## 2017-09-06 DIAGNOSIS — M1711 Unilateral primary osteoarthritis, right knee: Secondary | ICD-10-CM | POA: Diagnosis not present

## 2017-09-06 DIAGNOSIS — S83241A Other tear of medial meniscus, current injury, right knee, initial encounter: Secondary | ICD-10-CM

## 2017-09-06 NOTE — Telephone Encounter (Addendum)
Tried to call patient to discuss if he had MRI Done yet. Does not look like it so there is no need for his appt today. Follow up after having MRI done.    8:19 am   A report of the MRI was faxed to us. We will keep appt for today.

## 2017-09-06 NOTE — Telephone Encounter (Signed)
Called patient advised him to keep appt we do have MRI Report.

## 2017-09-06 NOTE — Progress Notes (Signed)
Office Visit Note   Patient: Jeffrey BuntingDavid Mccormick           Date of Birth: Feb 18, 1961           MRN: [id number] Visit Date: 09/06/2017              Requested by: No referring provider defined for this encounter. PCP: Patient, No Pcp Per   Assessment & Plan: Visit Diagnoses:  1. Acute medial meniscus tear, right, initial encounter   2. Acute lateral meniscus tear of right knee, initial encounter   3. Primary osteoarthritis of right knee     Plan: Impression is right knee medial and lateral meniscus tears with underlying osteoarthritis as well as an osteochondral defect to the lateral femoral condyle.  Today, I discussed proceeding with an intra-articular cortisone  injection to try and settle this down.  He really would like to avoid this for now.  I have discussed right knee arthroscopic debridement medial and lateral meniscus as well as chondroplasty and possible sub-chondroplasty.  I discussed with him that this will hopefully help his symptoms but would not resolve all of his issues due to his underlying osteoarthritis.  He would like to proceed with this anyway.  Risks benefits and possible complications reviewed.  Rehab and recovery time discussed.  All questions answered.  He does note that he has sleep apnea but does not use a CPAP.  He also states that his cardiologist told him that he had a heart attack last year.  His problem list includes blood clots, but when asked the patient states he just had a hematoma to his right shin from getting hit with a baseball.  We will obtain cardiac clearance prior to scheduling surgery. Total face to face encounter time was greater than 25 minutes and over half of this time was spent in counseling and/or coordination of care.  Follow-Up Instructions: Return for 1 week post-op.   Orders:  No orders of the defined types were placed in this encounter.  No orders of the defined types were placed in this encounter.     Procedures: No procedures  performed   Clinical Data: No additional findings.   Subjective: Chief Complaint  Patient presents with  . Right Knee - Pain, Follow-up    HPI patient is a pleasant 57 year old gentleman who presents to our clinic today to discuss MRI results of his right knee.  Injury occurred approximately 4 weeks ago when he stepped in quicksand twisting his right knee as he was trying [facility].  He has had sharp shooting pains medial aspect of the knee since.  There is been no improvement of symptoms over the past few weeks.  No previous surgical intervention to the right knee.  MRI of the right knee shows medial and lateral meniscus tears, osteochondral defect lateral femoral condyle as well as tricompartmental osteoarthritis.  Review of Systems as detailed in HPI.  All others reviewed and are negative.   Objective: Vital Signs: There were no vitals taken for this visit.  Physical Exam well-developed well-nourished gentleman in no acute distress.  Alert and oriented x3.  Ortho Exam stable knee exam  Specialty Comments:  No specialty comments available.  Imaging: No new x-rays today   PMFS History: Patient Active Problem List   Diagnosis Date Noted  . Acute pain of right knee 08/23/2017  . Pain in right hip 08/23/2017  . 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 Primary osteoarthritis of  right hip 04/25/2016   Past Medical History:  Diagnosis Date  . Arthritis   . Asthma    Albuterol inhaler as needed. Also has Neb treatment   . GERD (gastroesophageal reflux disease)    takes Protonix daily  . History of blood clots    pt states 20+ yrs ago-superficial  . History of bronchitis 2016  . History of colon polyps    benign  . History of gout   . History of hiatal hernia   . History of hyperlipidemia    was on meds but off for about 6 + yrs  . History of kidney stones   . Hypertension    takes Amlodipine daily  . Joint pain   . Pneumonia    hx  of-several yrs ago  . Sleep apnea     History reviewed. No pertinent family history.  Past Surgical History:  Procedure Laterality Date  . BACK SURGERY  2013   fusion-at [facility]  . COLONOSCOPY    . ESOPHAGOGASTRODUODENOSCOPY    . LITHOTRIPSY    . SHOULDER SURGERY Left   . TONSILLECTOMY  as a teenager  . TOTAL HIP ARTHROPLASTY Right 05/25/2016   Procedure: RIGHT TOTAL HIP ARTHROPLASTY ANTERIOR APPROACH;  Surgeon: Tarry Kos, MD;  Location: [facility] OR;  Service: Orthopedics;  Laterality: Right;  . UMBILICAL HERNIA REPAIR     Social History   Occupational History  . Not on file  Tobacco Use  . Smoking status: Current Some Day Smoker    Types: Cigars  . Smokeless tobacco: Current User    Types: Snuff  Substance and Sexual Activity  . Alcohol use: Yes    Comment: beer  . Drug use: No  . Sexual activity: Yes

## 2017-09-06 NOTE — Telephone Encounter (Signed)
Patient returned your call and stated that [place]  He wanted to make sure that we had the results faxed to us.  CB#[phone].  Thank you.

## 2017-09-12 ENCOUNTER — Telehealth (INDEPENDENT_AMBULATORY_CARE_PROVIDER_SITE_OTHER): Payer: Self-pay | Admitting: Orthopaedic Surgery

## 2017-09-12 NOTE — Telephone Encounter (Signed)
Patient called to see if you all received CD from [facility].  Please call patient to advise [phone]

## 2017-09-13 NOTE — Telephone Encounter (Signed)
Spoke to patient we did receive CD

## 2017-09-17 ENCOUNTER — Telehealth (INDEPENDENT_AMBULATORY_CARE_PROVIDER_SITE_OTHER): Payer: Self-pay | Admitting: Orthopaedic Surgery

## 2017-09-17 NOTE — Telephone Encounter (Signed)
Patient called wanting to know the status on his MRI results? CB # [phone]

## 2017-09-17 NOTE — Telephone Encounter (Signed)
I spoke with him.

## 2017-09-17 NOTE — Telephone Encounter (Signed)
Can you advise?  I see a note stating outside cd received. Let me know if we need to hold for Marisue Ivan.

## 2017-09-20 ENCOUNTER — Encounter (INDEPENDENT_AMBULATORY_CARE_PROVIDER_SITE_OTHER): Payer: Self-pay | Admitting: Orthopaedic Surgery

## 2017-09-20 ENCOUNTER — Ambulatory Visit (INDEPENDENT_AMBULATORY_CARE_PROVIDER_SITE_OTHER): Payer: BLUE CROSS/BLUE SHIELD | Admitting: Orthopaedic Surgery

## 2017-09-20 DIAGNOSIS — M1711 Unilateral primary osteoarthritis, right knee: Secondary | ICD-10-CM

## 2017-09-20 MED ORDER — LIDOCAINE HCL 1 % IJ SOLN
2.0000 mL | INTRAMUSCULAR | Status: AC | PRN
  Administered 2017-09-20: 2 mL

## 2017-09-20 MED ORDER — METHYLPREDNISOLONE ACETATE 40 MG/ML IJ SUSP
40.0000 mg | INTRAMUSCULAR | Status: AC | PRN
  Administered 2017-09-20: 40 mg via INTRA_ARTICULAR

## 2017-09-20 MED ORDER — BUPIVACAINE HCL 0.5 % IJ SOLN
2.0000 mL | INTRAMUSCULAR | Status: AC | PRN
  Administered 2017-09-20: 2 mL via INTRA_ARTICULAR

## 2017-09-20 NOTE — Progress Notes (Signed)
Office Visit Note   Patient: Jeffrey Mccormick           Date of Birth: July 27, 1960           MRN: [id number] Visit Date: 09/20/2017              Requested by: No referring provider defined for this encounter. PCP: Patient, No Pcp Per   Assessment & Plan: Visit Diagnoses:  1. Primary osteoarthritis of right knee     Plan: MRI findings are more consistent with early degenerative changes.  He does have some internal degenerative degeneration of his menisci but there do not appear to be displaced tears are overly impressive.  Cortisone injection performed today.  Patient is to work on quadricep strengthening questions encouraged and answered.  Follow-up as needed.  Follow-Up Instructions: Return if symptoms worsen or fail to improve.   Orders:  No orders of the defined types were placed in this encounter.  No orders of the defined types were placed in this encounter.     Procedures: Large Joint Inj: R knee on 09/20/2017 10:26 AM Indications: pain Details: 22 G needle  Arthrogram: No  Medications: 40 mg methylPREDNISolone acetate 40 MG/ML; 2 mL lidocaine 1 %; 2 mL bupivacaine 0.5 % Outcome: tolerated well, no immediate complications Consent was given by the patient. Patient was prepped and draped in the usual sterile fashion.       Clinical Data: No additional findings.   Subjective: Chief Complaint  Patient presents with  . Right Knee - Pain    Jeffrey Mccormick is coming in today for MRI review of his right knee.  He has throbbing pain that is worse at the day.  [name] currently working.   Review of Systems   Objective: Vital Signs: There were no vitals taken for this visit.  Physical Exam  Ortho Exam Right knee exam shows no joint effusion. Specialty Comments:  No specialty comments available.  Imaging: No results found.   PMFS History: Patient Active Problem List   Diagnosis Date Noted  . Acute pain of right knee 08/23/2017  . Pain in right hip 08/23/2017  .  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 Primary osteoarthritis of right hip 04/25/2016   Past Medical History:  Diagnosis Date  . Arthritis   . Asthma    Albuterol inhaler as needed. Also has Neb treatment   . GERD (gastroesophageal reflux disease)    takes Protonix daily  . History of blood clots    pt states 20+ yrs ago-superficial  . History of bronchitis 2016  . History of colon polyps    benign  . History of gout   . History of hiatal hernia   . History of hyperlipidemia    was on meds but off for about 6 + yrs  . History of kidney stones   . Hypertension    takes Amlodipine daily  . Joint pain   . Pneumonia    hx of-several yrs ago  . Sleep apnea     History reviewed. No pertinent family history.  Past Surgical History:  Procedure Laterality Date  . BACK SURGERY  2013   fusion-at [facility]  . COLONOSCOPY    . ESOPHAGOGASTRODUODENOSCOPY    . LITHOTRIPSY    . SHOULDER SURGERY Left   . TONSILLECTOMY  as a teenager  . TOTAL HIP ARTHROPLASTY Right 05/25/2016   Procedure: RIGHT TOTAL HIP ARTHROPLASTY ANTERIOR APPROACH;  Surgeon: Tarry Kos, MD;  Location: [facility] OR;  Service: Orthopedics;  Laterality: Right;  . UMBILICAL HERNIA REPAIR     Social History   Occupational History  . Not on file  Tobacco Use  . Smoking status: Current Some Day Smoker    Types: Cigars  . Smokeless tobacco: Current User    Types: Snuff  Substance and Sexual Activity  . Alcohol use: Yes    Comment: beer  . Drug use: No  . Sexual activity: Yes

## 2017-10-26 ENCOUNTER — Other Ambulatory Visit (INDEPENDENT_AMBULATORY_CARE_PROVIDER_SITE_OTHER): Payer: Self-pay | Admitting: Physician Assistant

## 2018-05-21 ENCOUNTER — Ambulatory Visit (INDEPENDENT_AMBULATORY_CARE_PROVIDER_SITE_OTHER): Payer: Self-pay

## 2018-05-21 ENCOUNTER — Encounter (INDEPENDENT_AMBULATORY_CARE_PROVIDER_SITE_OTHER): Payer: Self-pay | Admitting: Orthopaedic Surgery

## 2018-05-21 ENCOUNTER — Ambulatory Visit (INDEPENDENT_AMBULATORY_CARE_PROVIDER_SITE_OTHER): Payer: BLUE CROSS/BLUE SHIELD | Admitting: Orthopaedic Surgery

## 2018-05-21 DIAGNOSIS — M25562 Pain in left knee: Secondary | ICD-10-CM | POA: Diagnosis not present

## 2018-05-21 DIAGNOSIS — G8929 Other chronic pain: Secondary | ICD-10-CM | POA: Diagnosis not present

## 2018-05-21 DIAGNOSIS — Z96641 Presence of right artificial hip joint: Secondary | ICD-10-CM | POA: Diagnosis not present

## 2018-05-21 MED ORDER — METHYLPREDNISOLONE ACETATE 40 MG/ML IJ SUSP
40.0000 mg | INTRAMUSCULAR | Status: AC | PRN
  Administered 2018-05-21: 40 mg via INTRA_ARTICULAR

## 2018-05-21 MED ORDER — BUPIVACAINE HCL 0.25 % IJ SOLN
2.0000 mL | INTRAMUSCULAR | Status: AC | PRN
  Administered 2018-05-21: 2 mL via INTRA_ARTICULAR

## 2018-05-21 MED ORDER — LIDOCAINE HCL 1 % IJ SOLN
2.0000 mL | INTRAMUSCULAR | Status: AC | PRN
  Administered 2018-05-21: 2 mL

## 2018-05-21 NOTE — Progress Notes (Signed)
Office Visit Note   Patient: Jeffrey Mccormick           Date of Birth: May 01, 1961           MRN: [id number] Visit Date: 05/21/2018              Requested by: No referring provider defined for this encounter. PCP: Patient, No Pcp Per   Assessment & Plan: Visit Diagnoses:  1. Status post right hip replacement   2. Chronic pain of left knee     Plan: Impression is status post right total hip replacement doing well.  #2, left knee osteoarthritis and questionable lumbar radiculopathy.  In regards to the right hip, he will follow-up with [place] in 1 years time for recheck.  In regards to the left knee, we will inject this with cortisone today.  Should he have continued symptoms up his leg, he will follow-up with his back doctor.  Call with concerns or questions in the meantime.  Follow-Up Instructions: Return in about 1 year (around 05/22/2019).   Orders:  Orders Placed This Encounter  Procedures  . Large Joint Inj: L knee  . XR HIP UNILAT W OR W/O PELVIS 2-3 VIEWS RIGHT  . XR KNEE 3 VIEW LEFT   No orders of the defined types were placed in this encounter.     Procedures: Large Joint Inj: L knee on 05/21/2018 1:39 PM Indications: pain Details: 22 G needle, anterolateral approach Medications: 2 mL lidocaine 1 %; 2 mL bupivacaine 0.25 %; 40 mg methylPREDNISolone acetate 40 MG/ML      Clinical Data: No additional findings.   Subjective: Chief Complaint  Patient presents with  . Left Knee - Pain  . Right Hip - Follow-up    HPI patient is a pleasant 58 year old gentleman who presents to our clinic today for his 2-year follow-up of his right anterior total hip replacement, date of surgery 05/25/2016.  He has been doing excellent regards to the right hip.  His other issue today is his left knee.  He started riding his bike more at work over the past few weeks and began to have increased pain in the left knee.  The pain he has does start at the buttocks and radiates down to the knee.  He  has some instability with his knee as well.  Stairs seem to make this worse.  He denies any numbness tingling or burning.  Of note he is status post spinal fusion by a physician in [place].  Recently had x-rays there which were okay.  No previous cortisone injection to the left knee.  Review of Systems as detailed in HPI.  All others reviewed and are negative.   Objective: Vital Signs: There were no vitals taken for this visit.  Physical Exam well-developed well-nourished gentleman no acute distress.  Alert and oriented x3.  Ortho Exam examination of the right hip reveals full motion and strength.  His neurovascular intact distally.  Left knee exam shows no effusion.  Range of motion 0 to 120 degrees.  Medial and lateral joint line tenderness.  Minimal patellofemoral crepitus.  Stable valgus varus stress.  Neurovascularly intact distally.  Specialty Comments:  No specialty comments available.  Imaging: Xr Hip Unilat W Or W/o Pelvis 2-3 Views Right  Result Date: 05/21/2018 X-rays demonstrate a well-seated prosthesis without evidence of subsidence or ostial lysis  Xr Knee 3 View Left  Result Date: 05/21/2018 X-rays demonstrate moderate tricompartmental degenerative changes    PMFS History: Patient Active Problem  List   Diagnosis Date Noted  . Chronic pain of left knee 08/23/2017  . Pain in right hip 08/23/2017  . 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 Primary osteoarthritis of right hip 04/25/2016   Past Medical History:  Diagnosis Date  . Arthritis   . Asthma    Albuterol inhaler as needed. Also has Neb treatment   . GERD (gastroesophageal reflux disease)    takes Protonix daily  . History of blood clots    pt states 20+ yrs ago-superficial  . History of bronchitis 2016  . History of colon polyps    benign  . History of gout   . History of hiatal hernia   . History of hyperlipidemia    was on meds but off for about 6 + yrs  . History of kidney stones   .  Hypertension    takes Amlodipine daily  . Joint pain   . Pneumonia    hx of-several yrs ago  . Sleep apnea     History reviewed. No pertinent family history.  Past Surgical History:  Procedure Laterality Date  . BACK SURGERY  2013   fusion-at [facility]  . COLONOSCOPY    . ESOPHAGOGASTRODUODENOSCOPY    . LITHOTRIPSY    . SHOULDER SURGERY Left   . TONSILLECTOMY  as a teenager  . TOTAL HIP ARTHROPLASTY Right 05/25/2016   Procedure: RIGHT TOTAL HIP ARTHROPLASTY ANTERIOR APPROACH;  Surgeon: Tarry KosNaiping M Xu, MD;  Location: [facility] OR;  Service: Orthopedics;  Laterality: Right;  . UMBILICAL HERNIA REPAIR     Social History   Occupational History  . Not on file  Tobacco Use  . Smoking status: Current Some Day Smoker    Types: Cigars  . Smokeless tobacco: Current User    Types: Snuff  Substance and Sexual Activity  . Alcohol use: Yes    Comment: beer  . Drug use: No  . Sexual activity: Yes

## 2019-05-20 ENCOUNTER — Other Ambulatory Visit: Payer: Self-pay

## 2019-05-20 ENCOUNTER — Ambulatory Visit (INDEPENDENT_AMBULATORY_CARE_PROVIDER_SITE_OTHER): Payer: BC Managed Care – PPO | Admitting: Orthopaedic Surgery

## 2019-05-20 ENCOUNTER — Ambulatory Visit: Payer: Self-pay

## 2019-05-20 ENCOUNTER — Encounter: Payer: Self-pay | Admitting: Orthopaedic Surgery

## 2019-05-20 DIAGNOSIS — M25552 Pain in left hip: Secondary | ICD-10-CM

## 2019-05-20 DIAGNOSIS — Z96641 Presence of right artificial hip joint: Secondary | ICD-10-CM

## 2019-05-20 NOTE — Progress Notes (Signed)
Office Visit Note   Patient: Jeffrey Mccormick           Date of Birth: [birth year]           MRN: [id number] Visit Date: 05/20/2019              Requested by: No referring provider defined for this encounter. PCP: Patient, No Pcp Per   Assessment & Plan: Visit Diagnoses:  1. Status post right hip replacement   2. Pain in left hip     Plan: Impression is status post right total hip replacement doing well.  #2 left hip moderate degenerative joint disease.  Regards to her right hip, he is doing well.  He will continue to advance with activity as tolerated.  Follow-up with [place] in 1 to 2 years time for repeat evaluation AP pelvis x-rays.  Regards to the left hip, we will believe the majority of his pain is coming from his hip rather than his back.  We will refer him to Dr. Junius Roads for a diagnostic and hopefully therapeutic cortisone injection to the left hip joint.  Follow-up with [place] as needed.  Follow-Up Instructions: Return in about 1 year (around 05/19/2020).   Orders:  Orders Placed This Encounter  Procedures  . XR HIP UNILAT W OR W/O PELVIS 2-3 VIEWS RIGHT  . XR HIP UNILAT W OR W/O PELVIS 2-3 VIEWS LEFT   No orders of the defined types were placed in this encounter.     Procedures: No procedures performed   Clinical Data: No additional findings.   Subjective: Chief Complaint  Patient presents with  . Left Hip - Pain  . Right Hip - Follow-up    HPI patient is a pleasant 59 year old gentleman who presents our clinic today 2 years status post right total hip replacement 05/25/2016 as well as new onset left hip pain.  In regards to his right hip, he has done well.  Regards to the left hip, majority of his pain is to the groin.  He does note some pain to the lateral hip and buttocks.  He also notes some burning to the medial thigh.  He notes that his pain is worse when he is on his feet all day at work as well as at the end of the day.  He has been taking Norco with mild relief of  symptoms.  Does have a history of what sounds like a trochanteric bursa injection with moderate relief of symptoms.  Also has a history of spinal fusion.  He denies any pain or radicular symptoms to the lower leg or foot.  Review of Systems as detailed in HPI.  All others reviewed and are negative.   Objective: Vital Signs: There were no vitals taken for this visit.  Physical Exam well-developed well-nourished gentleman no acute distress.  Alert and oriented x3.  Ortho Exam examination of the right hip reveals negative logroll.  Examination of the left hip reveals positive logroll positive FADIR.  Negative straight leg raise.  [name] neurovascular intact distally.  Specialty Comments:  No specialty comments available.  Imaging: XR HIP UNILAT W OR W/O PELVIS 2-3 VIEWS LEFT  Result Date: 05/20/2019 X-rays demonstrate moderate joint space narrowing to the left hip joint  XR HIP UNILAT W OR W/O PELVIS 2-3 VIEWS RIGHT  Result Date: 05/20/2019 X-rays demonstrate a well-seated prosthesis without complication    PMFS History: Patient Active Problem List   Diagnosis Date Noted  . Chronic pain of left knee 08/23/2017  .  Pain in right hip 08/23/2017  . 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 Primary osteoarthritis of right hip 04/25/2016   Past Medical History:  Diagnosis Date  . Arthritis   . Asthma    Albuterol inhaler as needed. Also has Neb treatment   . GERD (gastroesophageal reflux disease)    takes Protonix daily  . History of blood clots    pt states 20+ yrs ago-superficial  . History of bronchitis 2016  . History of colon polyps    benign  . History of gout   . History of hiatal hernia   . History of hyperlipidemia    was on meds but off for about 6 + yrs  . History of kidney stones   . Hypertension    takes Amlodipine daily  . Joint pain   . Pneumonia    hx of-several yrs ago  . Sleep apnea     History reviewed. No pertinent family history.  Past Surgical  History:  Procedure Laterality Date  . BACK SURGERY  2013   fusion-at [facility]  . COLONOSCOPY    . ESOPHAGOGASTRODUODENOSCOPY    . LITHOTRIPSY    . SHOULDER SURGERY Left   . TONSILLECTOMY  as a teenager  . TOTAL HIP ARTHROPLASTY Right 05/25/2016   Procedure: RIGHT TOTAL HIP ARTHROPLASTY ANTERIOR APPROACH;  Surgeon: Tarry Kos, MD;  Location: [facility] OR;  Service: Orthopedics;  Laterality: Right;  . UMBILICAL HERNIA REPAIR     Social History   Occupational History  . Not on file  Tobacco Use  . Smoking status: Current Some Day Smoker    Types: Cigars  . Smokeless tobacco: Current User    Types: Snuff  Substance and Sexual Activity  . Alcohol use: Yes    Comment: beer  . Drug use: No  . Sexual activity: Yes

## 2019-05-20 NOTE — Progress Notes (Signed)
Subjective: Patient is here for ultrasound-guided intra-articular left hip injection.   Groin pain due to DJD.  Objective:  Pain with passive IR.  Procedure: Ultrasound-guided left hip injection: After sterile prep with Betadine, injected 8 cc 1% lidocaine without epinephrine and 40 mg methylprednisolone using a 22-gauge spinal needle, passing the needle through the iliofemoral ligament into the femoral head/neck junction.  Injectate seen filling joint capsule.  Good immediate relief.    

## 2019-05-21 ENCOUNTER — Ambulatory Visit (INDEPENDENT_AMBULATORY_CARE_PROVIDER_SITE_OTHER): Payer: BLUE CROSS/BLUE SHIELD | Admitting: Orthopaedic Surgery

## 2019-06-06 ENCOUNTER — Ambulatory Visit: Payer: BC Managed Care – PPO | Admitting: Orthopaedic Surgery

## 2019-06-06 ENCOUNTER — Other Ambulatory Visit: Payer: Self-pay

## 2019-06-06 ENCOUNTER — Encounter: Payer: Self-pay | Admitting: Orthopaedic Surgery

## 2019-06-06 DIAGNOSIS — M1612 Unilateral primary osteoarthritis, left hip: Secondary | ICD-10-CM

## 2019-06-06 NOTE — Progress Notes (Signed)
Office Visit Note   Patient: Jeffrey Mccormick           Date of Birth: 22-Jul-1960           MRN: [id number] Visit Date: 06/06/2019              Requested by: No referring provider defined for this encounter. PCP: Patient, No Pcp Per   Assessment & Plan: Visit Diagnoses:  1. Unilateral primary osteoarthritis, left hip     Plan: Impression is left hip end-stage generative joint disease.  The patient did have good relief during the anesthetic phase of his left hip injection therefore we believe all of his pain is actually coming from his hip but not his back.  We are unable to proceed with definitive treatment of a left total hip replacement at this point due to Covid restrictions.  We will go ahead and fill out paperwork and will call him once we are able to proceed.  In the meantime, he has noted that he has significant trouble working and there is not a desk work or light duty option.  He will plan on sending [place] FMLA paperwork to fill out in the meantime.  Otherwise, follow-up with [place] as needed.  Follow-Up Instructions: Return if symptoms worsen or fail to improve.   Orders:  No orders of the defined types were placed in this encounter.  No orders of the defined types were placed in this encounter.     Procedures: No procedures performed   Clinical Data: No additional findings.   Subjective: Chief Complaint  Patient presents with  . Left Hip - Pain    HPI patient is a pleasant 59 year old gentleman who comes in today with recurrent left hip pain.  History of left knee degenerative joint disease.  He saw Dr. Junius Roads on 05/20/2019 where hip injection was performed.  He had good relief of symptoms during the anesthetic phase.  His symptoms have returned and are quite aggravating.  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  Majority of his pain is to the groin and anterior thigh.     Objective: Vital Signs: There were no vitals  taken for this visit.    Ortho Exam stable exam of the left hip  Specialty Comments:  No specialty comments available.  Imaging: No results found.   PMFS History: Patient Active Problem List   Diagnosis Date Noted  . Chronic pain of left knee 08/23/2017  . Pain in right hip 08/23/2017  . 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 Primary osteoarthritis of right hip 04/25/2016   Past Medical History:  Diagnosis Date  . Arthritis   . Asthma    Albuterol inhaler as needed. Also has Neb treatment   . GERD (gastroesophageal reflux disease)    takes Protonix daily  . History of blood clots    pt states 20+ yrs ago-superficial  . History of bronchitis 2016  . History of colon polyps    benign  . History of gout   . History of hiatal hernia   . History of hyperlipidemia    was on meds but off for about 6 + yrs  . History of kidney stones   . Hypertension    takes Amlodipine daily  . Joint pain   . Pneumonia    hx of-several yrs ago  . Sleep apnea     History reviewed. No pertinent family history.  Past Surgical  History:  Procedure Laterality Date  . BACK SURGERY  2013   fusion-at [facility]  . COLONOSCOPY    . ESOPHAGOGASTRODUODENOSCOPY    . LITHOTRIPSY    . SHOULDER SURGERY Left   . TONSILLECTOMY  as a teenager  . TOTAL HIP ARTHROPLASTY Right 05/25/2016   Procedure: RIGHT TOTAL HIP ARTHROPLASTY ANTERIOR APPROACH;  Surgeon: Tarry Kos, MD;  Location: [facility] OR;  Service: Orthopedics;  Laterality: Right;  . UMBILICAL HERNIA REPAIR     Social History   Occupational History  . Not on file  Tobacco Use  . Smoking status: Current Some Day Smoker    Types: Cigars  . Smokeless tobacco: Current User    Types: Snuff  Substance and Sexual Activity  . Alcohol use: Yes    Comment: beer  . Drug use: No  . Sexual activity: Yes

## 2019-06-13 ENCOUNTER — Other Ambulatory Visit: Payer: Self-pay

## 2019-06-16 NOTE — Pre-Procedure Instructions (Addendum)
Finnian Husted  06/16/2019    Your procedure is scheduled on Monday, June 23, 2019 at 10:18 AM.   Report to [facility] Entrance "A" Admitting Office at 8:15 AM.   Call this number if you have problems the morning of surgery: [phone]   Questions prior to day of surgery, please call [phone] between 8 & 4 PM.   Remember:  Do not eat food after midnight Sunday, 06/22/19.   You may drink clear liquids until 7:15 AM.  Clear liquids allowed are: Water, Juice (non-citric and without pulp), Carbonated beverages, Clear Tea, Black Coffee only, Plain Jell-O only and Gatorade   Drink the Pre-Surgery Ensure between 7:00 AM and 7:15 AM. This will be the last thing you drink before surgery.    Take these medicines the morning of surgery with A SIP OF WATER: Amlodipine (Norvasc), Atorvastatin (Lipitor), Hydrocodone - if needed, Albuterol inhaler - if needed (bring inhaler with you day of surgery)  Stop NSAIDS (Voltaren, Diclofenac, Ibuprofen, Aleve, etc) and Multivitamins as of today prior to surgery. Do not use Aspirin products (BC Powders, Goody's, etc), Fish Oil or Herbal medications prior to surgery.  Do NOT smoke 24 hours prior to surgery.    Do not wear jewelry.  Do not wear lotions, powders, cologne or deodorant.  Men may shave face and neck.  Do not bring valuables to the hospital.  [facility] is not responsible for any belongings or valuables.  Contacts, dentures or bridgework may not be worn into surgery.  Leave your suitcase in the car.  After surgery it may be brought to your room.  For patients admitted to the hospital, discharge time will be determined by your treatment team.  [facility] - Preparing for Surgery  Before surgery, you can play an important role.  Because skin is not sterile, your skin needs to be as free of germs as possible.  You can reduce the number of germs on you skin by washing with CHG (chlorahexidine gluconate) soap before surgery.  CHG is an  antiseptic cleaner which kills germs and bonds with the skin to continue killing germs even after washing.  Oral Hygiene is also important in reducing the risk of infection.  Remember to brush your teeth with your regular toothpaste the morning of surgery.  Please DO NOT use if you have an allergy to CHG or antibacterial soaps.  If your skin becomes reddened/irritated stop using the CHG and inform your nurse when you arrive at Short Stay.  Do not shave (including legs and underarms) for at least 48 hours prior to the first CHG shower.  You may shave your face.  Please follow these instructions carefully:   1.  Shower with CHG Soap the night before surgery and the morning of Surgery.  2.  If you choose to wash your hair, wash your hair first as usual with your normal shampoo.  3.  After you shampoo, rinse your hair and body thoroughly to remove the shampoo. 4.  Use CHG as you would any other liquid soap.  You can apply chg directly to the skin and wash gently with a      scrungie or washcloth.           5.  Apply the CHG Soap to your body ONLY FROM THE NECK DOWN.   Do not use on open wounds or open sores. Avoid contact with your eyes, ears, mouth and genitals (private parts).  Wash genitals (private parts) with your  normal soap - do this prior to using CHG soap.  6.  Wash thoroughly, paying special attention to the area where your surgery will be performed.  7.  Thoroughly rinse your body with warm water from the neck down.  8.  DO NOT shower/wash with your normal soap after using and rinsing off the CHG Soap.  9.  Pat yourself dry with a clean towel.            10.  Wear clean pajamas.            11.  Place clean sheets on your bed the night of your first shower and do not sleep with pets.  Day of Surgery  Shower as above. Do not apply any lotions/deodorants the morning of surgery.   Please wear clean clothes to the hospital. Remember to brush your teeth with toothpaste.   Please read  over the fact sheets that you were given.

## 2019-06-17 ENCOUNTER — Encounter (HOSPITAL_COMMUNITY)
Admission: RE | Admit: 2019-06-17 | Discharge: 2019-06-17 | Disposition: A | Discharge status: Home / Self Care | Payer: BC Managed Care – PPO | Source: Ambulatory Visit | Attending: Orthopaedic Surgery | Admitting: Orthopaedic Surgery

## 2019-06-17 ENCOUNTER — Encounter (HOSPITAL_COMMUNITY): Payer: Self-pay

## 2019-06-17 ENCOUNTER — Other Ambulatory Visit: Payer: Self-pay

## 2019-06-17 DIAGNOSIS — Z86718 Personal history of other venous thrombosis and embolism: Secondary | ICD-10-CM | POA: Diagnosis not present

## 2019-06-17 DIAGNOSIS — Z87442 Personal history of urinary calculi: Secondary | ICD-10-CM | POA: Diagnosis not present

## 2019-06-17 DIAGNOSIS — I10 Essential (primary) hypertension: Secondary | ICD-10-CM | POA: Diagnosis not present

## 2019-06-17 DIAGNOSIS — Z87891 Personal history of nicotine dependence: Secondary | ICD-10-CM | POA: Insufficient documentation

## 2019-06-17 DIAGNOSIS — E785 Hyperlipidemia, unspecified: Secondary | ICD-10-CM | POA: Diagnosis not present

## 2019-06-17 DIAGNOSIS — J45909 Unspecified asthma, uncomplicated: Secondary | ICD-10-CM | POA: Diagnosis not present

## 2019-06-17 DIAGNOSIS — Z96641 Presence of right artificial hip joint: Secondary | ICD-10-CM | POA: Insufficient documentation

## 2019-06-17 DIAGNOSIS — G4733 Obstructive sleep apnea (adult) (pediatric): Secondary | ICD-10-CM | POA: Insufficient documentation

## 2019-06-17 DIAGNOSIS — M1712 Unilateral primary osteoarthritis, left knee: Secondary | ICD-10-CM | POA: Diagnosis not present

## 2019-06-17 DIAGNOSIS — Z01818 Encounter for other preprocedural examination: Secondary | ICD-10-CM | POA: Insufficient documentation

## 2019-06-17 DIAGNOSIS — K219 Gastro-esophageal reflux disease without esophagitis: Secondary | ICD-10-CM | POA: Diagnosis not present

## 2019-06-17 DIAGNOSIS — Z79899 Other long term (current) drug therapy: Secondary | ICD-10-CM | POA: Insufficient documentation

## 2019-06-17 DIAGNOSIS — Z981 Arthrodesis status: Secondary | ICD-10-CM | POA: Insufficient documentation

## 2019-06-17 HISTORY — DX: Headache, unspecified: R51.9

## 2019-06-17 LAB — SURGICAL PCR SCREEN
MRSA, PCR: NEGATIVE
Staphylococcus aureus: NEGATIVE

## 2019-06-17 LAB — COMPREHENSIVE METABOLIC PANEL
ALT: 39 U/L (ref 0–44)
AST: 27 U/L (ref 15–41)
Albumin: 4.1 g/dL (ref 3.5–5.0)
Alkaline Phosphatase: 73 U/L (ref 38–126)
Anion gap: 11 (ref 5–15)
BUN: 14 mg/dL (ref 6–20)
CO2: 23 mmol/L (ref 22–32)
Calcium: 9.3 mg/dL (ref 8.9–10.3)
Chloride: 99 mmol/L (ref 98–111)
Creatinine, Ser: 0.88 mg/dL (ref 0.61–1.24)
GFR calc Af Amer: >60 mL/min (ref >60)
GFR calc non Af Amer: >60 mL/min (ref >60)
Glucose, Bld: 81 mg/dL (ref 70–99)
Potassium: 4.1 mmol/L (ref 3.5–5.1)
Sodium: 133 mmol/L — ABNORMAL LOW (ref 135–145)
Total Bilirubin: 0.8 mg/dL (ref 0.3–1.2)
Total Protein: 6.6 g/dL (ref 6.5–8.1)

## 2019-06-17 LAB — CBC WITH DIFFERENTIAL/PLATELET
Abs Immature Granulocytes: 0.06 10*3/uL (ref 0.00–0.07)
Basophils Absolute: 0.1 10*3/uL (ref 0.0–0.1)
Basophils Relative: 1 %
Eosinophils Absolute: 0.6 10*3/uL — ABNORMAL HIGH (ref 0.0–0.5)
Eosinophils Relative: 5 %
HCT: 51.4 % (ref 39.0–52.0)
Hemoglobin: 16.7 g/dL (ref 13.0–17.0)
Immature Granulocytes: 1 %
Lymphocytes Relative: 38 %
Lymphs Abs: 4.7 10*3/uL — ABNORMAL HIGH (ref 0.7–4.0)
MCH: 27.7 pg (ref 26.0–34.0)
MCHC: 32.5 g/dL (ref 30.0–36.0)
MCV: 85.2 fL (ref 80.0–100.0)
Monocytes Absolute: 1.4 10*3/uL — ABNORMAL HIGH (ref 0.1–1.0)
Monocytes Relative: 11 %
Neutro Abs: 5.4 10*3/uL (ref 1.7–7.7)
Neutrophils Relative %: 44 %
Platelets: 287 10*3/uL (ref 150–400)
RBC: 6.03 MIL/uL — ABNORMAL HIGH (ref 4.22–5.81)
RDW: 14.4 % (ref 11.5–15.5)
WBC: 12.1 10*3/uL — ABNORMAL HIGH (ref 4.0–10.5)
nRBC: 0 % (ref 0.0–0.2)

## 2019-06-17 LAB — URINALYSIS, ROUTINE W REFLEX MICROSCOPIC
Bilirubin Urine: NEGATIVE
Glucose, UA: NEGATIVE mg/dL
Hgb urine dipstick: NEGATIVE
Ketones, ur: NEGATIVE mg/dL
Leukocytes,Ua: NEGATIVE
Nitrite: NEGATIVE
Protein, ur: NEGATIVE mg/dL
Specific Gravity, Urine: 1.018 (ref 1.005–1.030)
pH: 5 (ref 5.0–8.0)

## 2019-06-17 LAB — TYPE AND SCREEN
ABO/RH(D): O NEG
Antibody Screen: NEGATIVE

## 2019-06-17 LAB — PROTIME-INR
INR: 0.9 (ref 0.8–1.2)
Prothrombin Time: 12 seconds (ref 11.4–15.2)

## 2019-06-17 LAB — APTT: aPTT: 28 seconds (ref 24–36)

## 2019-06-17 NOTE — Progress Notes (Signed)
PCP - none Cardiologist - Dr. Kathryne Sharper, [place], [place]  Chest x-ray - N/A EKG - ? 3 months ago, requested Stress Test - denies ECHO - denies Cardiac Cath - ?2019, requested  Sleep Study - "years ago" not sure where CPAP - no  ERAS Protcol - yes PRE-SURGERY Ensure    COVID TEST- Pt had Covid in December, 2020. Pt brought a copy of positive result, placed in pt's chart.   Anesthesia review: Yes, have requested EKG, last office visit and cath report from Dr. Kathryne Sharper. Pt denies any cardiac history, but states everyone goes "berserk" when they see his EKG. Dr. Kathryne Sharper did a cath just to make sure everything was ok, pt states the cath was clean  Patient denies shortness of breath, fever, cough and chest pain at PAT appointment   All instructions explained to the patient, with a verbal understanding of the material. Patient agrees to go over the instructions while at home for a better understanding. Patient also instructed to self quarantine after being tested for COVID-19. The opportunity to ask questions was provided.

## 2019-06-18 ENCOUNTER — Encounter (HOSPITAL_COMMUNITY): Payer: Self-pay

## 2019-06-18 NOTE — Progress Notes (Signed)
Anesthesia Chart Review:  Case: [id number] Date/Time: 06/23/19 1003   Procedure: LEFT TOTAL HIP ARTHROPLASTY ANTERIOR APPROACH (Left Hip)   Anesthesia type: Spinal   Pre-op diagnosis: left hip osteoarthritis   [facility] ROOM 05 / [facility]   Surgeons: Tarry Kos, MD      DISCUSSION: Patient is a 59 year old male scheduled for the above procedure.   History includes smoking, HTN, HLD, GERD, hiatal hernia, OSA (intolerant to CPAP), asthma, "superficial" blood clot history (> 20 years ago), L3-4 posterior TLIF( 10/18/11, [facility]), right THA (05/25/16). Minimal CAD by 2019 cardiac cath. BMI is 41, consistent with morbid obesity.   No additional cardiac testing ordered following his 03/14/19 visit with cardiologist Dr. Kathryne Sharper. (Records on chart).   He tested positive for SARS-COVID-2 on 04/22/19, so he will not get a repeat COVID-19 test prior to surgery. He denied SOB, cough, fever, chest pain at PAT RN visit. Based on currently available information I would anticipate that he may proceed as planned if no acute changes.   VS: BP 137/86   Pulse 77   Temp 36.7 C (Oral)   Resp 20   Ht 6\' 1"  (1.854 m)   Wt (!) 141 kg   SpO2 97%   BMI 41.01 kg/m   PROVIDERS: Patient, No Pcp Per , MD is cardiologist [phone] [facility] [place]). Last evaluation 03/14/19. Known abnormal EKG (inferior Q waves). Has had intermittent evaluations for chest pain with stress test in 2017, 2018, and LHC/RHC in 2019 (see below). Continued medication therapy recommended at last visit. 8-10 month follow-up with labs planned.      LABS: Labs reviewed: Acceptable for surgery.  WBC 12.1. Labs are marked as reviewed by 2020, PA-C. (all labs ordered are listed, but only abnormal results are displayed)  Labs Reviewed  CBC WITH DIFFERENTIAL/PLATELET - Abnormal; Notable for the following components:      Result Value   WBC 12.1 (*)    RBC 6.03 (*)    Lymphs Abs 4.7 (*)    Monocytes Absolute  1.4 (*)    Eosinophils Absolute 0.6 (*)    All other components within normal limits  COMPREHENSIVE METABOLIC PANEL - Abnormal; Notable for the following components:   Sodium 133 (*)    All other components within normal limits  SURGICAL PCR SCREEN  APTT  PROTIME-INR  URINALYSIS, ROUTINE W REFLEX MICROSCOPIC  TYPE AND SCREEN     EKG: 03/14/19 ([facility]): Sinus rhythm Nonspecific QRS widening Old inferior-apical infarct Negative precordial T waves   CV: Echo 02/28/19 ([facility]): Impression: 1.  Very poor technical quality due to patient's morbid obesity and pulmonary disease.   2.  Mildly dilated LV with moderately severe concentric LVH.  LV diastolic relaxation is delayed with indeterminate resting LV filling pressure.  Ejection fraction is 60 to 65%.  There is severely increased left ventricular wall thickness. 3.  Normal right ventricular size and normal function.  RA pressure estimated as normal.  RVSP could not be estimated due to insufficient TR signal. 4.  Structurally and functionally grossly normal cardiac valves.   RHC/LHC 05/30/17 ([facility] [place]): Findings: Hemodynamics: 1.  Right atrial pressure mean 3. 2.  Right ventricular pressure 30/6. 3.  Pulmonary artery pressure 30/10. 4.  Pulmonary artery wedge pressure mean of 5 and end expiratory 10. 5.  Cardiac output by thermodilution with some technical limitations 6 L/min and cardiac output by Fick 4.5. 6.  Left ventricular pressure  115/10.  No aortic valve gradient on pullback. Left ventriculogram: The left ventriculogram shows normal left ventricular size and contractility.  The ejection fraction is estimated at about 60% with evidence of left ventricular hypertrophy.  No significant mitral regurgitation and grossly normal aortic root and thoracic aorta. Left coronary angiogram: 1: LM: Normal left main artery divided into the LAD and left circumflex.  The left main is normal. 2. LAD: Mild irregularities up to  about 25% in the mid segment, otherwise angiographically normal. 3. LCX: Angiographically near normal. 4. RCA: Dominant right coronary artery with minor diffuse narrowings in the mid segment about 10-15%. Selective renal angiograms: 1.  Selective left renal angiogram shows a single angiographically normal renal artery. 2.  Selective right renal angiogram shows a single angiographically normal right renal artery. IMPRESSION: 1.  Mild nonobstructive coronary artery disease.  The source of the patient's angina is likely noncardiac and additional work-up will be initiated including gastrointestinal causes. 2.  Evidence of low right and left ventricular filling pressures after period of fasting in preparation for heart catheterization.  The patient was administered IV saline and oral fluid intake will be encouraged following the procedure. 3.  Longstanding history of hypertension with evidence of left ventricular hypertrophy, but without evidence of renovascular etiology. 4.  Normal left ventricular size and function with evidence of LVH consistent with the findings on echocardiography without elevation of LVEDP. PLAN: Continue aggressive medical management for secondary prevention of is currently only minor nonobstructive disease with focus on cholesterol control, blood pressure control, weight loss, and management of his this metabolic syndrome.  Encourage use of CPAP mask for untreated sleep apnea.   Nuclear stress test 04/04/17 ([facility]): Impressions: 1.  No induced ischemia. 2.  Normal resting left ventricular systolic function. LVEF 66%. 3.  Possible old inferior wall MI versus diaphragmatic attenuation.   Past Medical History:  Diagnosis Date  . Arthritis   . Asthma    Albuterol inhaler as needed. Also has Neb treatment   . GERD (gastroesophageal reflux disease)    takes Protonix daily  . Headache    when he had Covid in December 2020 he had headaches  . History of blood clots     pt states 20+ yrs ago-superficial  . History of bronchitis 2016  . History of colon polyps    benign  . History of gout   . History of hiatal hernia   . History of hyperlipidemia    was on meds but off for about 6 + yrs  . History of kidney stones   . Hypertension    takes Amlodipine daily  . Joint pain   . Pneumonia    hx of-several yrs ago  . Sleep apnea    does not use a cpap    Past Surgical History:  Procedure Laterality Date  . BACK SURGERY  2013   fusion-at [facility]  . CARDIAC CATHETERIZATION     RHC/LHC 05/20/17 ([facility]): Mild non-obstructive CAD (minor irregulairies mid LAD up to 25%, 10-15% mid RCA), EF 60%, RA 3, RV 30/6, PA 30/10, PAWP 5 mean, 10 end exp, COthermodilution 6L/min, COfick 4.5, norrmal renal arteries  . COLONOSCOPY    . DOPPLER ECHOCARDIOGRAPHY     TTE 02/28/19 ([facility]): Poor technical quality, mildly dilated LV with moderately severe concentric LVH, EF 60-65%, normal RV size and function, grossly normal valves  . ESOPHAGOGASTRODUODENOSCOPY    . LITHOTRIPSY    . SHOULDER SURGERY Left   . TONSILLECTOMY  as a teenager  . TOTAL HIP ARTHROPLASTY Right 05/25/2016   Procedure: RIGHT TOTAL HIP ARTHROPLASTY ANTERIOR APPROACH;  Surgeon: Tarry Kos, MD;  Location: [facility] OR;  Service: Orthopedics;  Laterality: Right;  . UMBILICAL HERNIA REPAIR      MEDICATIONS: . albuterol (PROVENTIL HFA;VENTOLIN HFA) 108 (90 Base) MCG/ACT inhaler  . amLODipine (NORVASC) 2.5 MG tablet  . Ascorbic Acid (VITAMIN C PO)  . atorvastatin (LIPITOR) 10 MG tablet  . Cholecalciferol (VITAMIN D-3 PO)  . diclofenac sodium (VOLTAREN) 1 % GEL  . HYDROcodone-acetaminophen (NORCO) 7.5-325 MG tablet  . Multiple Vitamins-Minerals (ZINC PO)  . predniSONE (STERAPRED UNI-PAK 21 TAB) 10 MG (21) TBPK tablet  . sildenafil (REVATIO) 20 MG tablet   No current facility-administered medications for this encounter.  [name] not currently on Sterapred.   Shonna Chock, PA-C Surgical Short  Stay/Anesthesiology [facility] Phone [phone] [facility] Phone [phone] 06/18/2019 11:12 AM

## 2019-06-20 MED ORDER — DEXTROSE 5 % IV SOLN
3.0000 g | INTRAVENOUS | Status: AC
  Administered 2019-06-23: 3 g via INTRAVENOUS
  Filled 2019-06-20: qty 3

## 2019-06-20 MED ORDER — BUPIVACAINE LIPOSOME 1.3 % IJ SUSP
10.0000 mL | Freq: Once | INTRAMUSCULAR | Status: DC
  Filled 2019-06-20: qty 10

## 2019-06-20 MED ORDER — TRANEXAMIC ACID 1000 MG/10ML IV SOLN
2000.0000 mg | INTRAVENOUS | Status: DC
  Filled 2019-06-20: qty 20

## 2019-06-23 ENCOUNTER — Observation Stay (HOSPITAL_COMMUNITY): Payer: BC Managed Care – PPO

## 2019-06-23 ENCOUNTER — Ambulatory Visit (HOSPITAL_COMMUNITY): Payer: BC Managed Care – PPO

## 2019-06-23 ENCOUNTER — Telehealth: Payer: Self-pay

## 2019-06-23 ENCOUNTER — Observation Stay (HOSPITAL_COMMUNITY)
Admission: RE | Admit: 2019-06-23 | Discharge: 2019-06-23 | Disposition: A | Discharge status: Home / Self Care | Payer: BC Managed Care – PPO | Attending: Orthopaedic Surgery | Admitting: Orthopaedic Surgery

## 2019-06-23 ENCOUNTER — Ambulatory Visit (HOSPITAL_COMMUNITY): Payer: BC Managed Care – PPO | Admitting: Vascular Surgery

## 2019-06-23 ENCOUNTER — Encounter (HOSPITAL_COMMUNITY): Payer: Self-pay | Admitting: Orthopaedic Surgery

## 2019-06-23 ENCOUNTER — Other Ambulatory Visit: Payer: Self-pay

## 2019-06-23 ENCOUNTER — Ambulatory Visit (HOSPITAL_COMMUNITY): Payer: BC Managed Care – PPO | Admitting: Anesthesiology

## 2019-06-23 ENCOUNTER — Encounter (HOSPITAL_COMMUNITY)
Admission: RE | Disposition: A | Discharge status: Home / Self Care | Payer: Self-pay | Source: Home / Self Care | Attending: Orthopaedic Surgery

## 2019-06-23 DIAGNOSIS — I10 Essential (primary) hypertension: Secondary | ICD-10-CM | POA: Diagnosis not present

## 2019-06-23 DIAGNOSIS — J45909 Unspecified asthma, uncomplicated: Secondary | ICD-10-CM | POA: Diagnosis not present

## 2019-06-23 DIAGNOSIS — Z79899 Other long term (current) drug therapy: Secondary | ICD-10-CM | POA: Insufficient documentation

## 2019-06-23 DIAGNOSIS — G4730 Sleep apnea, unspecified: Secondary | ICD-10-CM | POA: Diagnosis not present

## 2019-06-23 DIAGNOSIS — Z6841 Body Mass Index (BMI) 40.0 and over, adult: Secondary | ICD-10-CM | POA: Diagnosis not present

## 2019-06-23 DIAGNOSIS — Z96649 Presence of unspecified artificial hip joint: Secondary | ICD-10-CM

## 2019-06-23 DIAGNOSIS — I2510 Atherosclerotic heart disease of native coronary artery without angina pectoris: Secondary | ICD-10-CM | POA: Diagnosis not present

## 2019-06-23 DIAGNOSIS — K219 Gastro-esophageal reflux disease without esophagitis: Secondary | ICD-10-CM | POA: Insufficient documentation

## 2019-06-23 DIAGNOSIS — M1612 Unilateral primary osteoarthritis, left hip: Principal | ICD-10-CM

## 2019-06-23 DIAGNOSIS — F17290 Nicotine dependence, other tobacco product, uncomplicated: Secondary | ICD-10-CM | POA: Insufficient documentation

## 2019-06-23 DIAGNOSIS — Z96642 Presence of left artificial hip joint: Secondary | ICD-10-CM

## 2019-06-23 DIAGNOSIS — E785 Hyperlipidemia, unspecified: Secondary | ICD-10-CM | POA: Insufficient documentation

## 2019-06-23 DIAGNOSIS — Z96641 Presence of right artificial hip joint: Secondary | ICD-10-CM | POA: Insufficient documentation

## 2019-06-23 DIAGNOSIS — Z419 Encounter for procedure for purposes other than remedying health state, unspecified: Secondary | ICD-10-CM

## 2019-06-23 HISTORY — PX: TOTAL HIP ARTHROPLASTY: SHX124

## 2019-06-23 SURGERY — ARTHROPLASTY, HIP, TOTAL, ANTERIOR APPROACH
Anesthesia: Spinal | Site: Hip | Laterality: Left

## 2019-06-23 MED ORDER — EPINEPHRINE PF 1 MG/ML IJ SOLN
INTRAMUSCULAR | Status: DC | PRN
  Administered 2019-06-23: .15 mL

## 2019-06-23 MED ORDER — METHOCARBAMOL 1000 MG/10ML IJ SOLN: 500.0000 mg | Freq: Four times a day (QID) | INTRAVENOUS | Status: DC | PRN

## 2019-06-23 MED ORDER — METHOCARBAMOL 500 MG PO TABS
ORAL_TABLET | ORAL | Status: AC
  Filled 2019-06-23: qty 2

## 2019-06-23 MED ORDER — PROPOFOL 10 MG/ML IV BOLUS
INTRAVENOUS | Status: DC | PRN
  Administered 2019-06-23: 40 mg via INTRAVENOUS

## 2019-06-23 MED ORDER — LACTATED RINGERS IV SOLN: INTRAVENOUS | Status: DC

## 2019-06-23 MED ORDER — METHOCARBAMOL 750 MG PO TABS: 750.0000 mg | ORAL_TABLET | Freq: Two times a day (BID) | ORAL | 3 refills | Status: AC | PRN

## 2019-06-23 MED ORDER — HYDROMORPHONE HCL 1 MG/ML IJ SOLN
0.2500 mg | INTRAMUSCULAR | Status: DC | PRN
  Administered 2019-06-23: 14:00:00 0.25 mg via INTRAVENOUS
  Administered 2019-06-23 (×3): 0.5 mg via INTRAVENOUS
  Administered 2019-06-23: 0.25 mg via INTRAVENOUS

## 2019-06-23 MED ORDER — 0.9 % SODIUM CHLORIDE (POUR BTL) OPTIME
TOPICAL | Status: DC | PRN
  Administered 2019-06-23: 1000 mL

## 2019-06-23 MED ORDER — HYDROMORPHONE HCL 1 MG/ML IJ SOLN: 0.5000 mg | INTRAMUSCULAR | Status: DC | PRN

## 2019-06-23 MED ORDER — EPHEDRINE SULFATE-NACL 50-0.9 MG/10ML-% IV SOSY
PREFILLED_SYRINGE | INTRAVENOUS | Status: DC | PRN
  Administered 2019-06-23 (×2): 5 mg via INTRAVENOUS
  Administered 2019-06-23 (×3): 10 mg via INTRAVENOUS

## 2019-06-23 MED ORDER — HYDROMORPHONE HCL 1 MG/ML IJ SOLN
INTRAMUSCULAR | Status: AC
  Filled 2019-06-23: qty 1

## 2019-06-23 MED ORDER — PHENYLEPHRINE HCL-NACL 10-0.9 MG/250ML-% IV SOLN
INTRAVENOUS | Status: DC | PRN
  Administered 2019-06-23: 40 ug/min via INTRAVENOUS

## 2019-06-23 MED ORDER — BUPIVACAINE LIPOSOME 1.3 % IJ SUSP
INTRAMUSCULAR | Status: DC | PRN
  Administered 2019-06-23: 20 mL

## 2019-06-23 MED ORDER — ATORVASTATIN CALCIUM 10 MG PO TABS: 10.0000 mg | ORAL_TABLET | Freq: Every day | ORAL | Status: DC

## 2019-06-23 MED ORDER — SODIUM CHLORIDE (PF) 0.9 % IJ SOLN
INTRAMUSCULAR | Status: DC | PRN
  Administered 2019-06-23: 20 mL via INTRAVENOUS

## 2019-06-23 MED ORDER — PHENOL 1.4 % MT LIQD: 1.0000 | OROMUCOSAL | Status: DC | PRN

## 2019-06-23 MED ORDER — LACTATED RINGERS IV BOLUS: 250.0000 mL | Freq: Once | INTRAVENOUS | Status: DC

## 2019-06-23 MED ORDER — ACETAMINOPHEN 325 MG PO TABS: 325.0000 mg | ORAL_TABLET | Freq: Four times a day (QID) | ORAL | Status: DC | PRN

## 2019-06-23 MED ORDER — BUPIVACAINE HCL (PF) 0.25 % IJ SOLN
INTRAMUSCULAR | Status: AC
  Filled 2019-06-23: qty 30

## 2019-06-23 MED ORDER — SODIUM CHLORIDE 0.9 % IR SOLN
Status: DC | PRN
  Administered 2019-06-23: 3000 mL

## 2019-06-23 MED ORDER — CEFAZOLIN SODIUM-DEXTROSE 2-4 GM/100ML-% IV SOLN: 2.0000 g | Freq: Four times a day (QID) | INTRAVENOUS | Status: DC

## 2019-06-23 MED ORDER — MAGNESIUM CITRATE PO SOLN: 1.0000 | Freq: Once | ORAL | Status: DC | PRN

## 2019-06-23 MED ORDER — KETOROLAC TROMETHAMINE 15 MG/ML IJ SOLN
15.0000 mg | Freq: Four times a day (QID) | INTRAMUSCULAR | Status: DC
  Administered 2019-06-23: 15 mg via INTRAVENOUS

## 2019-06-23 MED ORDER — ASPIRIN EC 81 MG PO TBEC: 81.0000 mg | DELAYED_RELEASE_TABLET | Freq: Two times a day (BID) | ORAL | 0 refills | Status: DC

## 2019-06-23 MED ORDER — CHLORHEXIDINE GLUCONATE 4 % EX LIQD: 60.0000 mL | Freq: Once | CUTANEOUS | Status: DC

## 2019-06-23 MED ORDER — DOCUSATE SODIUM 100 MG PO CAPS: 100.0000 mg | ORAL_CAPSULE | Freq: Two times a day (BID) | ORAL | Status: DC

## 2019-06-23 MED ORDER — DIPHENHYDRAMINE HCL 12.5 MG/5ML PO ELIX: 25.0000 mg | ORAL_SOLUTION | ORAL | Status: DC | PRN

## 2019-06-23 MED ORDER — ONDANSETRON HCL 4 MG PO TABS: 4.0000 mg | ORAL_TABLET | Freq: Four times a day (QID) | ORAL | Status: DC | PRN

## 2019-06-23 MED ORDER — OXYCODONE HCL ER 10 MG PO T12A: 10.0000 mg | EXTENDED_RELEASE_TABLET | Freq: Two times a day (BID) | ORAL | 0 refills | Status: AC

## 2019-06-23 MED ORDER — SULFAMETHOXAZOLE-TRIMETHOPRIM 800-160 MG PO TABS: 1.0000 | ORAL_TABLET | Freq: Two times a day (BID) | ORAL | 0 refills | Status: AC

## 2019-06-23 MED ORDER — MEPERIDINE HCL 25 MG/ML IJ SOLN: 6.2500 mg | INTRAMUSCULAR | Status: DC | PRN

## 2019-06-23 MED ORDER — ONDANSETRON HCL 4 MG/2ML IJ SOLN
INTRAMUSCULAR | Status: DC | PRN
  Administered 2019-06-23: 4 mg via INTRAVENOUS

## 2019-06-23 MED ORDER — VANCOMYCIN HCL 1000 MG IV SOLR
INTRAVENOUS | Status: AC
  Filled 2019-06-23: qty 1000

## 2019-06-23 MED ORDER — OXYCODONE HCL 5 MG PO TABS
ORAL_TABLET | ORAL | Status: AC
  Filled 2019-06-23: qty 2

## 2019-06-23 MED ORDER — EPINEPHRINE PF 1 MG/ML IJ SOLN
INTRAMUSCULAR | Status: AC
  Filled 2019-06-23: qty 1

## 2019-06-23 MED ORDER — ACETAMINOPHEN 500 MG PO TABS: 1000.0000 mg | ORAL_TABLET | Freq: Four times a day (QID) | ORAL | Status: DC

## 2019-06-23 MED ORDER — OXYCODONE HCL ER 10 MG PO T12A: 10.0000 mg | EXTENDED_RELEASE_TABLET | Freq: Two times a day (BID) | ORAL | Status: DC

## 2019-06-23 MED ORDER — METOCLOPRAMIDE HCL 5 MG PO TABS: 5.0000 mg | ORAL_TABLET | Freq: Three times a day (TID) | ORAL | Status: DC | PRN

## 2019-06-23 MED ORDER — ALBUTEROL SULFATE HFA 108 (90 BASE) MCG/ACT IN AERS: 1.0000 | INHALATION_SPRAY | Freq: Four times a day (QID) | RESPIRATORY_TRACT | Status: DC | PRN

## 2019-06-23 MED ORDER — PROMETHAZINE HCL 25 MG/ML IJ SOLN: 6.2500 mg | INTRAMUSCULAR | Status: DC | PRN

## 2019-06-23 MED ORDER — GABAPENTIN 300 MG PO CAPS: 300.0000 mg | ORAL_CAPSULE | Freq: Three times a day (TID) | ORAL | Status: DC

## 2019-06-23 MED ORDER — VANCOMYCIN HCL 1 G IV SOLR
INTRAVENOUS | Status: DC | PRN
  Administered 2019-06-23: 1000 mg via TOPICAL

## 2019-06-23 MED ORDER — BUPIVACAINE HCL (PF) 0.25 % IJ SOLN
INTRAMUSCULAR | Status: DC | PRN
  Administered 2019-06-23: 30 mL

## 2019-06-23 MED ORDER — METOCLOPRAMIDE HCL 5 MG/ML IJ SOLN: 5.0000 mg | Freq: Three times a day (TID) | INTRAMUSCULAR | Status: DC | PRN

## 2019-06-23 MED ORDER — TRANEXAMIC ACID-NACL 1000-0.7 MG/100ML-% IV SOLN
1000.0000 mg | INTRAVENOUS | Status: AC
  Administered 2019-06-23: 1000 mg via INTRAVENOUS
  Filled 2019-06-23: qty 100

## 2019-06-23 MED ORDER — KETOROLAC TROMETHAMINE 15 MG/ML IJ SOLN
INTRAMUSCULAR | Status: AC
  Filled 2019-06-23: qty 1

## 2019-06-23 MED ORDER — LACTATED RINGERS IV BOLUS: 500.0000 mL | Freq: Once | INTRAVENOUS | Status: DC

## 2019-06-23 MED ORDER — PROPOFOL 500 MG/50ML IV EMUL
INTRAVENOUS | Status: DC | PRN
  Administered 2019-06-23: 50 ug/kg/min via INTRAVENOUS

## 2019-06-23 MED ORDER — ONDANSETRON HCL 4 MG PO TABS: 4.0000 mg | ORAL_TABLET | Freq: Three times a day (TID) | ORAL | 0 refills | Status: AC | PRN

## 2019-06-23 MED ORDER — KETOROLAC TROMETHAMINE 10 MG PO TABS: 10.0000 mg | ORAL_TABLET | Freq: Two times a day (BID) | ORAL | 0 refills | Status: AC | PRN

## 2019-06-23 MED ORDER — METHOCARBAMOL 750 MG PO TABS
750.0000 mg | ORAL_TABLET | Freq: Four times a day (QID) | ORAL | Status: DC | PRN
  Administered 2019-06-23: 750 mg via ORAL

## 2019-06-23 MED ORDER — TRANEXAMIC ACID 1000 MG/10ML IV SOLN
INTRAVENOUS | Status: DC | PRN
  Administered 2019-06-23: 2000 mg via TOPICAL

## 2019-06-23 MED ORDER — ALUM & MAG HYDROXIDE-SIMETH 200-200-20 MG/5ML PO SUSP: 30.0000 mL | ORAL | Status: DC | PRN

## 2019-06-23 MED ORDER — POVIDONE-IODINE 10 % EX SWAB
2.0000 "application " | Freq: Once | CUTANEOUS | Status: AC
  Administered 2019-06-23: 2 via TOPICAL

## 2019-06-23 MED ORDER — DEXAMETHASONE SODIUM PHOSPHATE 10 MG/ML IJ SOLN: 10.0000 mg | Freq: Once | INTRAMUSCULAR | Status: DC

## 2019-06-23 MED ORDER — OXYCODONE HCL 5 MG PO TABS: 10.0000 mg | ORAL_TABLET | ORAL | Status: DC | PRN

## 2019-06-23 MED ORDER — AMLODIPINE BESYLATE 2.5 MG PO TABS: 2.5000 mg | ORAL_TABLET | Freq: Every day | ORAL | Status: DC

## 2019-06-23 MED ORDER — ASPIRIN 81 MG PO CHEW: 81.0000 mg | CHEWABLE_TABLET | Freq: Two times a day (BID) | ORAL | Status: DC

## 2019-06-23 MED ORDER — SORBITOL 70 % SOLN: 30.0000 mL | Freq: Every day | Status: DC | PRN

## 2019-06-23 MED ORDER — OXYCODONE HCL 5 MG PO TABS: 5.0000 mg | ORAL_TABLET | Freq: Three times a day (TID) | ORAL | 0 refills | Status: AC | PRN

## 2019-06-23 MED ORDER — ONDANSETRON HCL 4 MG/2ML IJ SOLN: 4.0000 mg | Freq: Four times a day (QID) | INTRAMUSCULAR | Status: DC | PRN

## 2019-06-23 MED ORDER — POLYETHYLENE GLYCOL 3350 17 G PO PACK: 17.0000 g | PACK | Freq: Every day | ORAL | Status: DC | PRN

## 2019-06-23 MED ORDER — MENTHOL 3 MG MT LOZG: 1.0000 | LOZENGE | OROMUCOSAL | Status: DC | PRN

## 2019-06-23 MED ORDER — SODIUM CHLORIDE 0.9 % IV SOLN: INTRAVENOUS | Status: DC

## 2019-06-23 MED ORDER — TRANEXAMIC ACID-NACL 1000-0.7 MG/100ML-% IV SOLN: 1000.0000 mg | Freq: Once | INTRAVENOUS | Status: DC

## 2019-06-23 MED ORDER — OXYCODONE HCL 5 MG PO TABS
5.0000 mg | ORAL_TABLET | ORAL | Status: DC | PRN
  Administered 2019-06-23: 14:00:00 10 mg via ORAL

## 2019-06-23 SURGICAL SUPPLY — 60 items
BAG DECANTER FOR FLEXI CONT (MISCELLANEOUS) ×2 IMPLANT
CELLS DAT CNTRL 66122 CELL SVR (MISCELLANEOUS) ×1 IMPLANT
COVER PERINEAL POST (MISCELLANEOUS) ×2 IMPLANT
COVER SURGICAL LIGHT HANDLE (MISCELLANEOUS) ×2 IMPLANT
COVER WAND RF STERILE (DRAPES) IMPLANT
CUP SECTOR GRIPTON 58MM (Orthopedic Implant) ×2 IMPLANT
DERMABOND ADVANCED (GAUZE/BANDAGES/DRESSINGS) ×1
DERMABOND ADVANCED .7 DNX12 (GAUZE/BANDAGES/DRESSINGS) ×1 IMPLANT
DRAPE C-ARM 42X72 X-RAY (DRAPES) ×2 IMPLANT
DRAPE POUCH INSTRU U-SHP 10X18 (DRAPES) ×2 IMPLANT
DRAPE STERI IOBAN 125X83 (DRAPES) ×2 IMPLANT
DRAPE U-SHAPE 47X51 STRL (DRAPES) ×4 IMPLANT
DRSG AQUACEL AG ADV 3.5X10 (GAUZE/BANDAGES/DRESSINGS) ×2 IMPLANT
DURAPREP 26ML APPLICATOR (WOUND CARE) ×4 IMPLANT
ELECT BLADE 4.0 EZ CLEAN MEGAD (MISCELLANEOUS) ×2
ELECT REM PT RETURN 9FT ADLT (ELECTROSURGICAL) ×2
ELECTRODE BLDE 4.0 EZ CLN MEGD (MISCELLANEOUS) ×1 IMPLANT
ELECTRODE REM PT RTRN 9FT ADLT (ELECTROSURGICAL) ×1 IMPLANT
GLOVE BIOGEL PI IND STRL 7.0 (GLOVE) ×1 IMPLANT
GLOVE BIOGEL PI INDICATOR 7.0 (GLOVE) ×1
GLOVE ECLIPSE 7.0 STRL STRAW (GLOVE) ×6 IMPLANT
GLOVE SKINSENSE NS SZ7.5 (GLOVE) ×1
GLOVE SKINSENSE STRL SZ7.5 (GLOVE) ×1 IMPLANT
GLOVE SURG SYN 7.5  E (GLOVE) ×4
GLOVE SURG SYN 7.5 E (GLOVE) ×4 IMPLANT
GOWN STRL REIN XL XLG (GOWN DISPOSABLE) ×2 IMPLANT
GOWN STRL REUS W/ TWL LRG LVL3 (GOWN DISPOSABLE) IMPLANT
GOWN STRL REUS W/ TWL XL LVL3 (GOWN DISPOSABLE) ×1 IMPLANT
GOWN STRL REUS W/TWL LRG LVL3 (GOWN DISPOSABLE)
GOWN STRL REUS W/TWL XL LVL3 (GOWN DISPOSABLE) ×1
HANDPIECE INTERPULSE COAX TIP (DISPOSABLE) ×1
HEAD CERAMIC 36 PLUS5 (Hips) ×2 IMPLANT
HOOD PEEL AWAY FLYTE STAYCOOL (MISCELLANEOUS) ×6 IMPLANT
IV NS IRRIG 3000ML ARTHROMATIC (IV SOLUTION) ×2 IMPLANT
KIT BASIN OR (CUSTOM PROCEDURE TRAY) ×2 IMPLANT
LINER NEUTRAL 36X58 PLUS4 ×2 IMPLANT
MARKER SKIN DUAL TIP RULER LAB (MISCELLANEOUS) ×2 IMPLANT
NEEDLE SPNL 18GX3.5 QUINCKE PK (NEEDLE) ×4 IMPLANT
PACK TOTAL JOINT (CUSTOM PROCEDURE TRAY) ×2 IMPLANT
PACK UNIVERSAL I (CUSTOM PROCEDURE TRAY) ×2 IMPLANT
RTRCTR WOUND ALEXIS 18CM MED (MISCELLANEOUS) ×2
SAW OSC TIP CART 19.5X105X1.3 (SAW) ×2 IMPLANT
SCREW 6.5MMX25MM (Screw) ×2 IMPLANT
SET HNDPC FAN SPRY TIP SCT (DISPOSABLE) ×1 IMPLANT
STAPLER VISISTAT 35W (STAPLE) IMPLANT
STEM FEM ACTIS HIGH SZ8 (Stem) ×2 IMPLANT
SUT ETHIBOND 2 V 37 (SUTURE) ×2 IMPLANT
SUT ETHILON 2 0 FS 18 (SUTURE) ×6 IMPLANT
SUT VIC AB 0 CT1 27 (SUTURE) ×1
SUT VIC AB 0 CT1 27XBRD ANBCTR (SUTURE) ×1 IMPLANT
SUT VIC AB 1 CTX 36 (SUTURE) ×2
SUT VIC AB 1 CTX36XBRD ANBCTR (SUTURE) ×2 IMPLANT
SUT VIC AB 2-0 CT1 27 (SUTURE) ×3
SUT VIC AB 2-0 CT1 TAPERPNT 27 (SUTURE) ×3 IMPLANT
SYR 50ML LL SCALE MARK (SYRINGE) ×4 IMPLANT
TOWEL GREEN STERILE (TOWEL DISPOSABLE) ×2 IMPLANT
TRAY CATH 16FR W/PLASTIC CATH (SET/KITS/TRAYS/PACK) IMPLANT
TRAY FOLEY W/BAG SLVR 16FR (SET/KITS/TRAYS/PACK)
TRAY FOLEY W/BAG SLVR 16FR ST (SET/KITS/TRAYS/PACK) IMPLANT
YANKAUER SUCT BULB TIP NO VENT (SUCTIONS) ×2 IMPLANT

## 2019-06-23 NOTE — Op Note (Signed)
LEFT TOTAL HIP ARTHROPLASTY ANTERIOR APPROACH  Procedure Note Jeffrey Mccormick   [id number]  Pre-op Diagnosis: left hip osteoarthritis     Post-op Diagnosis: same   Operative Procedures  1. Total hip replacement; Left hip; uncemented [id number]   Personnel  Surgeon(s): Leandrew Koyanagi, MD  Assist: Madalyn Rob, PA-C; necessary for the timely completion of procedure and due to complexity of procedure.   Anesthesia: spinal  Prosthesis: Depuy Acetabulum: Pinnacle 58 mm Femur: Actis 8 HO Head: 36 mm size: +5 Liner: +4 neutral Bearing Type: ceramic on poly  Total Hip Arthroplasty (Anterior Approach) Op Note:  After informed consent was obtained and the operative extremity marked in the holding area, the patient was brought back to the operating room and placed supine on the HANA table. Next, the operative extremity was prepped and draped in normal sterile fashion. Surgical timeout occurred verifying patient identification, surgical site, surgical procedure and administration of antibiotics.  A modified anterior Smith-Peterson approach to the hip was performed, using the interval between tensor fascia lata and sartorius.  Dissection was carried bluntly down onto the anterior hip capsule. The lateral femoral circumflex vessels were identified and coagulated. A capsulotomy was performed and the capsular flaps tagged for later repair.  The neck osteotomy was performed. The femoral head was removed, the acetabular rim was cleared of soft tissue and attention was turned to reaming the acetabulum.  Sequential reaming was performed under fluoroscopic guidance. We reamed to a size 57 mm, and then impacted the acetabular shell. A single cancellous screw was placed.  The liner was then placed after irrigation and attention turned to the femur.  After placing the femoral hook, the leg was taken to externally rotated, extended and adducted position taking care to perform soft tissue releases to allow  for adequate mobilization of the femur. Soft tissue was cleared from the shoulder of the greater trochanter and the hook elevator used to improve exposure of the proximal femur. Sequential broaching performed up to a size 8. Trial neck and head were placed. The leg was brought back up to neutral and the construct reduced. The position and sizing of components, offset and leg lengths were checked using fluoroscopy. Stability of the construct was checked in extension and external rotation without any subluxation or impingement of prosthesis. We dislocated the prosthesis, dropped the leg back into position, removed trial components, and irrigated copiously. The final stem and head was then placed, the leg brought back up, the system reduced and fluoroscopy used to verify positioning.  We irrigated, obtained hemostasis and closed the capsule using #2 ethibond suture.  One gram of vancomycin powder was placed in the surgical bed. The fascia was closed with #1 vicryl plus, the deep fat layer was closed with 0 vicryl, the subcutaneous layers closed with 2.0 Vicryl Plus and the skin closed with 2.0 nylon and dermabond. A sterile dressing was applied. The patient was awakened in the operating room and taken to recovery in stable condition.  All sponge, needle, and instrument counts were correct at the end of the case.   Position: supine  Complications: see description of procedure.  Time Out: performed   Drains/Packing: none  Estimated blood loss: see anesthesia record  Returned to Recovery Room: in good condition.   Antibiotics: yes   Mechanical VTE (DVT) Prophylaxis: sequential compression devices, TED thigh-high  Chemical VTE (DVT) Prophylaxis: aspirin   Fluid Replacement: see anesthesia record  Specimens Removed: 1 to pathology   Sponge and Instrument  Count Correct? yes   PACU: portable radiograph - low AP   Plan/RTC: Return in 2 weeks for staple removal. Weight Bearing/Load Lower Extremity:  full  Hip precautions: none Suture Removal: 2 weeks   N. Glee Arvin, MD [facility] 12:15 PM   Implant Name Type Inv. Item Serial No. Manufacturer Lot No. LRB No. Used Action  CUP SECTOR GRIPTON - [id number] Orthopedic Implant CUP SECTOR GRIPTON  DEPUY SYNTHES [id number] Left 1 Implanted  SCREW 6.5MMX25MM - [id number] Screw SCREW 6.5MMX25MM  DEPUY SYNTHES [id number] Left 1 Implanted  LINER NEUTRAL 57X36MM PLUS4 - [id number]  LINER NEUTRAL 57X36MM PLUS4  DEPUY SYNTHES [id number] Left 1 Implanted

## 2019-06-23 NOTE — Transfer of Care (Signed)
Immediate Anesthesia Transfer of Care Note  Patient: Jeffrey Mccormick  Procedure(s) Performed: LEFT TOTAL HIP ARTHROPLASTY ANTERIOR APPROACH (Left Hip)  Patient Location: PACU  Anesthesia Type:Spinal  Level of Consciousness: awake  Airway & Oxygen Therapy: Patient Spontanous Breathing and Patient connected to nasal cannula oxygen  Post-op Assessment: Report given to RN and Post -op Vital signs reviewed and stable  Post vital signs: Reviewed and stable  Last Vitals:  Vitals Value Taken Time  BP 86/59 06/23/19 1249  Temp    Pulse 65 06/23/19 1252  Resp 12 06/23/19 1252  SpO2 91 % 06/23/19 1252  Vitals shown include unvalidated device data.  Last Pain:  Vitals:   06/23/19 0836  PainSc: 6          Complications: No apparent anesthesia complications

## 2019-06-23 NOTE — Telephone Encounter (Signed)
Faxed HHPT Order to [facility]  Fax # [phone] Phone # [phone]

## 2019-06-23 NOTE — Evaluation (Signed)
Physical Therapy Evaluation Patient Details Name: Jeffrey Mccormick MRN: [id number] DOB: April 10, 1961 Today's Date: 06/23/2019   History of Present Illness  Pt is a 59 y/o male s/p L THA, direct anterior. PMH includes asthma, gout, sleep apnea, and R THA.   Clinical Impression  Pt s/p surgery above with deficits below. Required min guard to min A for mobility tasks this session. Educated about performing ankle pumps and heel slides for HEP at home. Pt reports wife can assist as needed. Pt plans to d/c today; will sign off as all further needs can be met with follow up therapies recommended by surgeon. If needs change, please re-consult.     Follow Up Recommendations Follow surgeon's recommendation for DC plan and follow-up therapies;Supervision for mobility/OOB    Equipment Recommendations  None recommended by PT    Recommendations for Other Services       Precautions / Restrictions Precautions Precautions: None Restrictions Weight Bearing Restrictions: Yes RLE Weight Bearing: Weight bearing as tolerated      Mobility  Bed Mobility Overal bed mobility: Needs Assistance Bed Mobility: Supine to Sit;Sit to Supine     Supine to sit: Min assist Sit to supine: Min assist   General bed mobility comments: Min A for LLE assist.   Transfers Overall transfer level: Needs assistance Equipment used: Rolling walker (2 wheeled) Transfers: Sit to/from Stand Sit to Stand: Min guard         General transfer comment: Min guard A for steadying. Cues for safe hand placement.   Ambulation/Gait Ambulation/Gait assistance: Min guard Gait Distance (Feet): 30 Feet(X2) Assistive device: Rolling walker (2 wheeled) Gait Pattern/deviations: Step-to pattern;Decreased step length - right;Decreased step length - left;Decreased weight shift to left;Antalgic Gait velocity: Decreased   General Gait Details: Slow, antalgic gait. Cues for sequencing using RW. Pt with increased SOB, however, reports that is  baseline since having COVID in december.   Stairs Stairs: Yes Stairs assistance: Min guard Stair Management: Forwards;With walker;Step to pattern Number of Stairs: 1 General stair comments: Practiced stair navigation on single step. Cues for sequencing. Educated about using sideways technique for home when ascending/descending steps.   Wheelchair Mobility    Modified Rankin (Stroke Patients Only)       Balance Overall balance assessment: Needs assistance Sitting-balance support: No upper extremity supported;Feet supported Sitting balance-Leahy Scale: Good     Standing balance support: Bilateral upper extremity supported;During functional activity Standing balance-Leahy Scale: Poor Standing balance comment: Reliant on BUE support                              Pertinent Vitals/Pain Pain Assessment: Faces Faces Pain Scale: Hurts even more Pain Location: L hip  Pain Descriptors / Indicators: Aching;Grimacing;Operative site guarding Pain Intervention(s): Limited activity within patient's tolerance;Monitored during session;Repositioned    Home Living Family/patient expects to be discharged to:: Private residence Living Arrangements: Spouse/significant other Available Help at Discharge: Family Type of Home: House Home Access: Stairs to enter Entrance Stairs-Rails: None Entrance Stairs-Number of Steps: 1 Home Layout: Two level Home Equipment: Environmental consultant - 2 wheels;Bedside commode;Shower seat      Prior Function Level of Independence: Independent               Hand Dominance        Extremity/Trunk Assessment   Upper Extremity Assessment Upper Extremity Assessment: Overall WFL for tasks assessed    Lower Extremity Assessment Lower Extremity Assessment: LLE deficits/detail LLE Deficits /  Details: deficits consistent with post op pain and weakness.     Cervical / Trunk Assessment Cervical / Trunk Assessment: Normal  Communication   Communication: No  difficulties  Cognition Arousal/Alertness: Awake/alert Behavior During Therapy: WFL for tasks assessed/performed Overall Cognitive Status: Within Functional Limits for tasks assessed                                        General Comments      Exercises     Assessment/Plan    PT Assessment All further PT needs can be met in the next venue of care  PT Problem List Decreased strength;Decreased balance;Decreased range of motion;Decreased mobility;Decreased knowledge of use of DME;Decreased knowledge of precautions       PT Treatment Interventions      PT Goals (Current goals can be found in the Care Plan section)  Acute Rehab PT Goals Patient Stated Goal: to go home PT Goal Formulation: With patient Time For Goal Achievement: 06/23/19 Potential to Achieve Goals: Good    Frequency     Barriers to discharge        Co-evaluation               AM-PAC PT "6 Clicks" Mobility  Outcome Measure Help needed turning from your back to your side while in a flat bed without using bedrails?: A Little Help needed moving from lying on your back to sitting on the side of a flat bed without using bedrails?: A Little Help needed moving to and from a bed to a chair (including a wheelchair)?: A Little Help needed standing up from a chair using your arms (e.g., wheelchair or bedside chair)?: A Little Help needed to walk in hospital room?: A Little Help needed climbing 3-5 steps with a railing? : A Little 6 Click Score: 18    End of Session Equipment Utilized During Treatment: Gait belt Activity Tolerance: Patient limited by fatigue Patient left: in bed;with call bell/phone within reach Nurse Communication: Mobility status PT Visit Diagnosis: Difficulty in walking, not elsewhere classified (R26.2);Pain Pain - Right/Left: Left Pain - part of body: Hip    Time: 1610-9604 PT Time Calculation (min) (ACUTE ONLY): 29 min   Charges:   PT Evaluation $PT Eval Low  Complexity: 1 Low PT Treatments $Gait Training: 8-22 mins        Jeffrey Mccormick, DPT  Acute Rehabilitation Services  Pager: [phone] Office: [phone]   Jeffrey Mccormick 06/23/2019, 5:43 PM

## 2019-06-23 NOTE — Anesthesia Preprocedure Evaluation (Signed)
Anesthesia Evaluation  Patient identified by MRN, date of birth, ID band Patient awake    Reviewed: Allergy & Precautions, H&P , NPO status , Patient's Chart, lab work & pertinent test results, reviewed documented beta blocker date and time   Airway Mallampati: I  TM Distance: <3 FB Neck ROM: full    Dental no notable dental hx. (+) Teeth Intact   Pulmonary asthma , sleep apnea , pneumonia, Current Smoker and Patient abstained from smoking.,    Pulmonary exam normal        Cardiovascular hypertension, Pt. on medications Normal cardiovascular exam     Neuro/Psych  Headaches, negative psych ROS   GI/Hepatic Neg liver ROS, hiatal hernia, GERD  Medicated and Controlled,  Endo/Other  Morbid obesity  Renal/GU negative Renal ROS     Musculoskeletal  (+) Arthritis ,   Abdominal (+) + obese,   Peds  Hematology negative hematology ROS (+)   Anesthesia Other Findings   Reproductive/Obstetrics                             Anesthesia Physical  Anesthesia Plan  ASA: III  Anesthesia Plan: Spinal   Post-op Pain Management:    Induction:   PONV Risk Score and Plan: 1 and Ondansetron, Propofol infusion and Treatment may vary due to age or medical condition  Airway Management Planned: Simple Face Mask  Additional Equipment:   Intra-op Plan:   Post-operative Plan:   Informed Consent: I have reviewed the patients History and Physical, chart, labs and discussed the procedure including the risks, benefits and alternatives for the proposed anesthesia with the patient or authorized representative who has indicated his/her understanding and acceptance.     Dental advisory given  Plan Discussed with: CRNA  Anesthesia Plan Comments: (PAT note written 06/18/2019 by Shonna Chock, PA-C.)        Anesthesia Quick Evaluation

## 2019-06-23 NOTE — Discharge Instructions (Signed)

## 2019-06-23 NOTE — H&P (Signed)
PREOPERATIVE H&P  Chief Complaint: left hip osteoarthritis  HPI: Jeffrey Mccormick is a 59 y.o. male who presents for surgical treatment of left hip osteoarthritis.  He denies any changes in medical history.  Past Medical History:  Diagnosis Date  . Arthritis   . Asthma    Albuterol inhaler as needed. Also has Neb treatment   . GERD (gastroesophageal reflux disease)    takes Protonix daily  . Headache    when he had Covid in December 2020 he had headaches  . History of blood clots    pt states 20+ yrs ago-superficial  . History of bronchitis 2016  . History of colon polyps    benign  . History of gout   . History of hiatal hernia   . History of hyperlipidemia    was on meds but off for about 6 + yrs  . History of kidney stones   . Hypertension    takes Amlodipine daily  . Joint pain   . Pneumonia    hx of-several yrs ago  . Sleep apnea    does not use a cpap   Past Surgical History:  Procedure Laterality Date  . BACK SURGERY  2013   fusion-at [facility]  . CARDIAC CATHETERIZATION     RHC/LHC 05/20/17 ([facility]): Mild non-obstructive CAD (minor irregulairies mid LAD up to 25%, 10-15% mid RCA), EF 60%, RA 3, RV 30/6, PA 30/10, PAWP 5 mean, 10 end exp, COthermodilution 6L/min, COfick 4.5, norrmal renal arteries  . COLONOSCOPY    . DOPPLER ECHOCARDIOGRAPHY     TTE 02/28/19 ([facility]): Poor technical quality, mildly dilated LV with moderately severe concentric LVH, EF 60-65%, normal RV size and function, grossly normal valves  . ESOPHAGOGASTRODUODENOSCOPY    . LITHOTRIPSY    . SHOULDER SURGERY Left   . TONSILLECTOMY  as a teenager  . TOTAL HIP ARTHROPLASTY Right 05/25/2016   Procedure: RIGHT TOTAL HIP ARTHROPLASTY ANTERIOR APPROACH;  Surgeon: Tarry Kos, MD;  Location: [facility] OR;  Service: Orthopedics;  Laterality: Right;  . UMBILICAL HERNIA REPAIR     Social History   Socioeconomic History  . Marital status: Married    Spouse name: Not on file  . Number of children:  Not on file  . Years of education: Not on file  . Highest education level: Not on file  Occupational History  . Not on file  Tobacco Use  . Smoking status: Current Some Day Smoker    Types: Cigars  . Smokeless tobacco: Current User    Types: Snuff  . Tobacco comment: only smokes when he plays golf  Substance and Sexual Activity  . Alcohol use: Yes    Comment:  beer 3-4 times a week  . Drug use: No  . Sexual activity: Yes  Other Topics Concern  . Not on file  Social History Narrative  . Not on file   Social Determinants of Health   Financial Resource Strain:   . Difficulty of Paying Living Expenses: Not on file  Food Insecurity:   . Worried About Programme researcher, broadcasting/film/video in the Last Year: Not on file  . Ran Out of Food in the Last Year: Not on file  Transportation Needs:   . Lack of Transportation (Medical): Not on file  . Lack of Transportation (Non-Medical): Not on file  Physical Activity:   . Days of Exercise per Week: Not on file  . Minutes of Exercise per Session: Not on file  Stress:   .  Feeling of Stress : Not on file  Social Connections:   . Frequency of Communication with Friends and Family: Not on file  . Frequency of Social Gatherings with Friends and Family: Not on file  . Attends Religious Services: Not on file  . Active Member of Clubs or Organizations: Not on file  . Attends Archivist Meetings: Not on file  . Marital Status: Not on file   No family history on file. No Known Allergies Prior to Admission medications   Medication Sig Start Date End Date Taking? Authorizing Provider  albuterol (PROVENTIL HFA;VENTOLIN HFA) 108 (90 Base) MCG/ACT inhaler Inhale 1-2 puffs into the lungs every 6 (six) hours as needed for wheezing or shortness of breath.   Yes [provider]  amLODipine (NORVASC) 2.5 MG tablet Take 2.5 mg by mouth daily.   Yes [provider]  Ascorbic Acid (VITAMIN C PO) Take 1 tablet by mouth every evening.   Yes  [provider]  atorvastatin (LIPITOR) 10 MG tablet Take 10 mg by mouth daily.  05/09/17  Yes [provider]  Cholecalciferol (VITAMIN D-3 PO) Take 1 tablet by mouth every evening.   Yes [provider]  diclofenac sodium (VOLTAREN) 1 % GEL Apply 1 application topically 3 (three) times daily as needed (Hip pain).    Yes [provider]  HYDROcodone-acetaminophen (NORCO) 7.5-325 MG tablet Take 1 tablet by mouth every 6 (six) hours as needed for moderate pain.   Yes [provider]  Multiple Vitamins-Minerals (ZINC PO) Take 1 tablet by mouth every evening.   Yes [provider]  sildenafil (REVATIO) 20 MG tablet Take 60 mg by mouth daily as needed (erectile dysfunction.).   Yes [provider]  predniSONE (STERAPRED UNI-PAK 21 TAB) 10 MG (21) TBPK tablet Take as directed Patient not taking: Reported on 05/21/2017 07/10/16   Leandrew Koyanagi, MD     Positive ROS: All other systems have been reviewed and were otherwise negative with the exception of those mentioned in the HPI and as above.  Physical Exam: General: Alert, no acute distress Cardiovascular: No pedal edema Respiratory: No cyanosis, no use of accessory musculature GI: abdomen soft Skin: No lesions in the area of chief complaint Neurologic: Sensation intact distally Psychiatric: Patient is competent for consent with normal mood and affect Lymphatic: no lymphedema  MUSCULOSKELETAL: exam stable  Assessment: left hip osteoarthritis  Plan: Plan for Procedure(s): LEFT TOTAL HIP ARTHROPLASTY ANTERIOR APPROACH  The risks benefits and alternatives were discussed with the patient including but not limited to the risks of nonoperative treatment, versus surgical intervention including infection, bleeding, nerve injury,  blood clots, cardiopulmonary complications, morbidity, mortality, among others, and they were willing to proceed.   Preoperative templating of the joint  replacement has been completed, documented, and submitted to the Operating Room personnel in order to optimize intra-operative equipment management.  Eduard Roux, MD   06/23/2019 7:01 AM

## 2019-06-24 ENCOUNTER — Telehealth: Payer: Self-pay

## 2019-06-24 ENCOUNTER — Encounter: Payer: Self-pay | Admitting: *Deleted

## 2019-06-24 DIAGNOSIS — M1612 Unilateral primary osteoarthritis, left hip: Secondary | ICD-10-CM

## 2019-06-24 NOTE — Telephone Encounter (Signed)
[employer] [facility]) does not take patients ins. [facility] -cannot go to [employer] [employer]- they state they have no availability.   Per Mardella Layman she states to go ahead and put in a PT referral for out patient.

## 2019-06-24 NOTE — Telephone Encounter (Signed)
Baird Lyons with [employer] [facility] called stating that [employer] is out of network for HHPT.  Cb# [phone].  Please advise.  Thank you.

## 2019-06-24 NOTE — Telephone Encounter (Signed)
Can we get him an Appt ASAP. Thank you.

## 2019-06-24 NOTE — Anesthesia Postprocedure Evaluation (Signed)
Anesthesia Post Note  Patient: Jeffrey Mccormick  Procedure(s) Performed: LEFT TOTAL HIP ARTHROPLASTY ANTERIOR APPROACH (Left Hip)     Patient location during evaluation: PACU Anesthesia Type: Spinal Level of consciousness: sedated and patient cooperative Pain management: pain level controlled Vital Signs Assessment: post-procedure vital signs reviewed and stable Respiratory status: spontaneous breathing Cardiovascular status: stable Anesthetic complications: no    Last Vitals:  Vitals:   06/23/19 1445 06/23/19 1600  BP: 99/67 110/79  Pulse: 74 97  Resp: 18 20  Temp:  36.7 C  SpO2: 94% 97%    Last Pain:  Vitals:   06/23/19 1600  PainSc: 4                  Lynia Landry [employer]

## 2019-06-24 NOTE — Telephone Encounter (Signed)
Someone working on it now

## 2019-06-24 NOTE — Telephone Encounter (Signed)
Just FYI: I saw where you sent Lis message. Wanted to send this to you so you will see also.

## 2019-06-24 NOTE — Telephone Encounter (Signed)
Thank you :)

## 2019-06-24 NOTE — Telephone Encounter (Signed)
See other message

## 2019-06-26 NOTE — Discharge Summary (Signed)
Patient ID: Jeffrey Mccormick MRN: [id number] DOB/AGE: March 18, 1961 59 y.o.  Admit date: 06/23/2019 Discharge date: 06/26/2019  Admission Diagnoses:  Primary osteoarthritis of left hip  Discharge Diagnoses:  Principal Problem:   Primary osteoarthritis of left hip Active Problems:   Status post total replacement of left hip   Past Medical History:  Diagnosis Date  . Arthritis   . Asthma    Albuterol inhaler as needed. Also has Neb treatment   . GERD (gastroesophageal reflux disease)    takes Protonix daily  . Headache    when he had Covid in December 2020 he had headaches  . History of blood clots    pt states 20+ yrs ago-superficial  . History of bronchitis 2016  . History of colon polyps    benign  . History of gout   . History of hiatal hernia   . History of hyperlipidemia    was on meds but off for about 6 + yrs  . History of kidney stones   . Hypertension    takes Amlodipine daily  . Joint pain   . Pneumonia    hx of-several yrs ago  . Sleep apnea    does not use a cpap    Surgeries: Procedure(s): LEFT TOTAL HIP ARTHROPLASTY ANTERIOR APPROACH on 06/23/2019   Consultants (if any):   Discharged Condition: Improved  Hospital Course: Jeffrey Mccormick is an 59 y.o. male who was admitted 06/23/2019 with a diagnosis of Primary osteoarthritis of left hip and went to the operating room on 06/23/2019 and underwent the above named procedures.    He was given perioperative antibiotics:  Anti-infectives (From admission, onward)   Start     Dose/Rate Route Frequency Ordered Stop   06/23/19 1630  ceFAZolin (ANCEF) IVPB 2g/100 mL premix  Status:  Discontinued     2 g 200 mL/hr over 30 Minutes Intravenous Every 6 hours 06/23/19 1625 06/23/19 2203   06/23/19 1108  vancomycin (VANCOCIN) powder  Status:  Discontinued       As needed 06/23/19 1109 06/23/19 1246   06/23/19 0400  ceFAZolin (ANCEF) 3 g in dextrose 5 % 50 mL IVPB     3 g 100 mL/hr over 30 Minutes Intravenous On call to  O.R. 06/20/19 0738 06/23/19 1023   06/23/19 0000  sulfamethoxazole-trimethoprim (BACTRIM DS) 800-160 MG tablet     1 tablet Oral 2 times daily 06/23/19 1008      .  He was given sequential compression devices, early ambulation, and appropriate chemoprophylaxis for DVT prophylaxis.  He benefited maximally from the hospital stay and there were no complications.    Recent vital signs:  Vitals:   06/23/19 1445 06/23/19 1600  BP: 99/67 110/79  Pulse: 74 97  Resp: 18 20  Temp:  98 F (36.7 C)  SpO2: 94% 97%    Recent laboratory studies:  Lab Results  Component Value Date   HGB 16.7 06/17/2019   HGB 9.1 (L) 05/27/2016   HGB 9.3 (L) 05/26/2016   Lab Results  Component Value Date   WBC 12.1 (H) 06/17/2019   PLT 287 06/17/2019   Lab Results  Component Value Date   INR 0.9 06/17/2019   Lab Results  Component Value Date   NA 133 (L) 06/17/2019   K 4.1 06/17/2019   CL 99 06/17/2019   CO2 23 06/17/2019   BUN 14 06/17/2019   CREATININE 0.88 06/17/2019   GLUCOSE 81 06/17/2019    Discharge Medications:   Allergies as  of 06/23/2019   No Known Allergies     Medication List    TAKE these medications   albuterol 108 (90 Base) MCG/ACT inhaler Commonly known as: VENTOLIN HFA Inhale 1-2 puffs into the lungs every 6 (six) hours as needed for wheezing or shortness of breath.   amLODipine 2.5 MG tablet Commonly known as: NORVASC Take 2.5 mg by mouth daily.   aspirin EC 81 MG tablet Take 1 tablet (81 mg total) by mouth 2 (two) times daily.   atorvastatin 10 MG tablet Commonly known as: LIPITOR Take 10 mg by mouth daily.   diclofenac sodium 1 % Gel Commonly known as: VOLTAREN Apply 1 application topically 3 (three) times daily as needed (Hip pain).   HYDROcodone-acetaminophen 7.5-325 MG tablet Commonly known as: NORCO Take 1 tablet by mouth every 6 (six) hours as needed for moderate pain.   ketorolac 10 MG tablet Commonly known as: TORADOL Take 1 tablet (10 mg total)  by mouth 2 (two) times daily as needed.   methocarbamol 750 MG tablet Commonly known as: ROBAXIN Take 1 tablet (750 mg total) by mouth 2 (two) times daily as needed for muscle spasms.   ondansetron 4 MG tablet Commonly known as: ZOFRAN Take 1-2 tablets (4-8 mg total) by mouth every 8 (eight) hours as needed for nausea or vomiting.   oxyCODONE 10 mg 12 hr tablet Commonly known as: OXYCONTIN Take 1 tablet (10 mg total) by mouth every 12 (twelve) hours for 3 days.   oxyCODONE 5 MG immediate release tablet Commonly known as: Oxy IR/ROXICODONE Take 1-2 tablets (5-10 mg total) by mouth every 8 (eight) hours as needed for severe pain.   predniSONE 10 MG (21) Tbpk tablet Commonly known as: STERAPRED UNI-PAK 21 TAB Take as directed   sildenafil 20 MG tablet Commonly known as: REVATIO Take 60 mg by mouth daily as needed (erectile dysfunction.).   sulfamethoxazole-trimethoprim 800-160 MG tablet Commonly known as: BACTRIM DS Take 1 tablet by mouth 2 (two) times daily.   VITAMIN C PO Take 1 tablet by mouth every evening.   VITAMIN D-3 PO Take 1 tablet by mouth every evening.   ZINC PO Take 1 tablet by mouth every evening.       Diagnostic Studies: DG Pelvis Portable  Result Date: 06/23/2019 CLINICAL DATA:  Left hip replacement. EXAM: PORTABLE PELVIS 1-2 VIEWS COMPARISON:  Left hip x-rays dated May 20, 2019. FINDINGS: The left hip demonstrates a total arthroplasty without evidence of hardware failure or complication. There is no fracture or dislocation. The alignment is anatomic. Post-surgical changes noted in the surrounding soft tissues. Prior right total hip arthroplasty. IMPRESSION: Interval left total hip arthroplasty without acute postoperative complication. Electronically Signed   By: Titus Dubin M.D.   On: 06/23/2019 13:36   DG C-Arm 1-60 Min  Result Date: 06/23/2019 CLINICAL DATA:  Left hip replacement. EXAM: OPERATIVE LEFT HIP (WITH PELVIS IF PERFORMED) TECHNIQUE:  Fluoroscopic spot image(s) were submitted for interpretation post-operatively. FLUOROSCOPY TIME:  31 seconds. COMPARISON:  Left hip x-rays dated May 20, 2019. FINDINGS: Intraoperative fluoroscopic images demonstrate interval left total hip arthroplasty. Components are well aligned. No acute osseous abnormality. Prior right total hip arthroplasty. IMPRESSION: Intraoperative fluoroscopic guidance for left total hip arthroplasty. Electronically Signed   By: Titus Dubin M.D.   On: 06/23/2019 13:34   DG HIP OPERATIVE UNILAT WITH PELVIS LEFT  Result Date: 06/23/2019 CLINICAL DATA:  Left hip replacement. EXAM: OPERATIVE LEFT HIP (WITH PELVIS IF PERFORMED) TECHNIQUE: Fluoroscopic spot image(s)  were submitted for interpretation post-operatively. FLUOROSCOPY TIME:  31 seconds. COMPARISON:  Left hip x-rays dated May 20, 2019. FINDINGS: Intraoperative fluoroscopic images demonstrate interval left total hip arthroplasty. Components are well aligned. No acute osseous abnormality. Prior right total hip arthroplasty. IMPRESSION: Intraoperative fluoroscopic guidance for left total hip arthroplasty. Electronically Signed   By: Obie Dredge M.D.   On: 06/23/2019 13:34    Disposition: Discharge disposition: 01-Home or Self Care         Follow-up Information    Tarry Kos, MD In 2 weeks.   Specialty: Orthopedic Surgery Why: For suture removal, For wound re-check Contact information: [address] [place] [address] [phone]            Signed: Glee Arvin 06/26/2019, 5:08 PM

## 2019-06-27 ENCOUNTER — Telehealth: Payer: Self-pay | Admitting: Orthopaedic Surgery

## 2019-06-27 ENCOUNTER — Telehealth: Payer: Self-pay

## 2019-06-27 NOTE — Telephone Encounter (Signed)
Please call patient in regards to [facility]. He needs someone in his network. Please call patient to advise.  [phone]  Needs new script for differt [facility] service.

## 2019-06-27 NOTE — Telephone Encounter (Signed)
I did! Put when the home health agency called to get him schedule the wife turned them down stating they wanted [facility]. I called pt wife back and explained to her that [facility] does not accept their insurance and so she stated she go ahead with [facility], I called [facility] and they informed they closed his referral due to they did nto want the service and if they decide they want it then it will be mid march before they can get out to pt. I spoke with pt again and he stated he wanted [facility] with [facility] on [address] so I sen tteh referral there. They are outpt PT.

## 2019-06-27 NOTE — Telephone Encounter (Signed)
Received VM (transferred by [facility]. They state they would like to verify patient SU info ([employer]).  PH: [phone] Claim # [id number]   I called them back and gave them info about his SU.

## 2019-06-27 NOTE — Telephone Encounter (Signed)
You did find one for him. Is that correct?

## 2019-06-30 NOTE — Telephone Encounter (Signed)
Anything I need to do?

## 2019-06-30 NOTE — Telephone Encounter (Signed)
No just an FYI 

## 2019-07-08 ENCOUNTER — Other Ambulatory Visit: Payer: Self-pay

## 2019-07-08 ENCOUNTER — Encounter: Payer: Self-pay | Admitting: Orthopaedic Surgery

## 2019-07-08 ENCOUNTER — Ambulatory Visit (HOSPITAL_COMMUNITY)
Admission: RE | Admit: 2019-07-08 | Discharge: 2019-07-08 | Disposition: A | Discharge status: Home / Self Care | Payer: BC Managed Care – PPO | Source: Ambulatory Visit | Attending: Orthopaedic Surgery | Admitting: Orthopaedic Surgery

## 2019-07-08 ENCOUNTER — Ambulatory Visit (INDEPENDENT_AMBULATORY_CARE_PROVIDER_SITE_OTHER): Payer: BC Managed Care – PPO | Admitting: Physician Assistant

## 2019-07-08 DIAGNOSIS — M79662 Pain in left lower leg: Secondary | ICD-10-CM | POA: Diagnosis not present

## 2019-07-08 DIAGNOSIS — M7989 Other specified soft tissue disorders: Secondary | ICD-10-CM

## 2019-07-08 DIAGNOSIS — Z96642 Presence of left artificial hip joint: Secondary | ICD-10-CM

## 2019-07-08 NOTE — Addendum Note (Signed)
Addended by: Wendi Maya on: 07/08/2019 01:25 PM   Modules accepted: Orders

## 2019-07-08 NOTE — Progress Notes (Signed)
Post-Op Visit Note   Patient: Jeffrey Mccormick           Date of Birth: 1961/03/21           MRN: [id number] Visit Date: 07/08/2019 PCP: Patient, No Pcp Per   Assessment & Plan:  Chief Complaint:  Chief Complaint  Patient presents with  . Left Hip - Pain, Routine Post Op   Visit Diagnoses:  1. H/O total hip arthroplasty, left     Plan: Patient is a pleasant 59 year old gentleman who presents for follow-up today 2 weeks status post left total hip replacement.  He has been doing fairly well.  He has been in outpatient physical therapy over the past week where he is ambulating with a walker.  He has moderate pain which is relieved with oxycodone.  No fevers or chills.  He does complain of moderate calf pain to the left lower extremity.  No chest pain or shortness of breath.  He has been wearing TED hose.  Examination of the left hip reveals a well-healing surgical incision with nylon sutures in place.  No evidence of infection or cellulitis.  He does have moderate swelling to the left lower extremity with moderate calf tenderness.  Negative Homans.  At this point, nylon sutures were removed and Steri-Strips applied.  We will obtain a Doppler ultrasound of the left lower extremity to rule out DVT.  Should the patient develop any chest pain or shortness of breath, he has been instructed to go to the ED.  Patient will continue with physical therapy.  He does not need to refill narcotics at this point.  He does need to be out of work for at least 12 weeks which will be until about 09/20/2019.  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  Dental prophylaxis reinforced.  Follow-Up Instructions: Return in about 4 weeks (around 08/05/2019).   Orders:  No orders of the defined types were placed in this encounter.  No orders of the defined types were placed in this encounter.   Imaging: No new imaging  PMFS History: Patient Active Problem List   Diagnosis Date  Noted  . Primary osteoarthritis of left hip 06/23/2019  . Status post total replacement of left hip 06/23/2019  . Chronic pain of left knee 08/23/2017  . Hip joint replacement status 05/25/2016   Past Medical History:  Diagnosis Date  . Arthritis   . Asthma    Albuterol inhaler as needed. Also has Neb treatment   . GERD (gastroesophageal reflux disease)    takes Protonix daily  . Headache    when he had Covid in December 2020 he had headaches  . History of blood clots    pt states 20+ yrs ago-superficial  . History of bronchitis 2016  . History of colon polyps    benign  . History of gout   . History of hiatal hernia   . History of hyperlipidemia    was on meds but off for about 6 + yrs  . History of kidney stones   . Hypertension    takes Amlodipine daily  . Joint pain   . Pneumonia    hx of-several yrs ago  . Sleep apnea    does not use a cpap    History reviewed. No pertinent family history.  Past Surgical History:  Procedure Laterality Date  . BACK SURGERY  2013   fusion-at [facility]  . CARDIAC CATHETERIZATION  RHC/LHC 05/20/17 ([place]): Mild non-obstructive CAD (minor irregulairies mid LAD up to 25%, 10-15% mid RCA), EF 60%, RA 3, RV 30/6, PA 30/10, PAWP 5 mean, 10 end exp, COthermodilution 6L/min, COfick 4.5, norrmal renal arteries  . COLONOSCOPY    . DOPPLER ECHOCARDIOGRAPHY     TTE 02/28/19 ([facility]): Poor technical quality, mildly dilated LV with moderately severe concentric LVH, EF 60-65%, normal RV size and function, grossly normal valves  . ESOPHAGOGASTRODUODENOSCOPY    . LITHOTRIPSY    . SHOULDER SURGERY Left   . TONSILLECTOMY  as a teenager  . TOTAL HIP ARTHROPLASTY Right 05/25/2016   Procedure: RIGHT TOTAL HIP ARTHROPLASTY ANTERIOR APPROACH;  Surgeon: Leandrew Koyanagi, MD;  Location: [place];  Service: Orthopedics;  Laterality: Right;  . TOTAL HIP ARTHROPLASTY Left 06/23/2019   Procedure: LEFT TOTAL HIP ARTHROPLASTY ANTERIOR APPROACH;  Surgeon: Leandrew Koyanagi, MD;  Location: [place];  Service: Orthopedics;  Laterality: Left;  . UMBILICAL HERNIA REPAIR     Social History   Occupational History  . Not on file  Tobacco Use  . Smoking status: Current Some Day Smoker    Types: Cigars  . Smokeless tobacco: Current User    Types: Snuff  . Tobacco comment: only smokes when he plays golf  Substance and Sexual Activity  . Alcohol use: Yes    Comment:  beer 3-4 times a week  . Drug use: No  . Sexual activity: Yes

## 2019-07-08 NOTE — Progress Notes (Signed)
Left lower extremity venous duplex has been completed. Preliminary results can be found in CV Proc through chart review.  Results were given to Dr. Roda Shutters.  07/08/19 3:26 PM Olen Cordial RVT

## 2019-07-15 ENCOUNTER — Other Ambulatory Visit: Payer: Self-pay | Admitting: Orthopaedic Surgery

## 2019-07-23 ENCOUNTER — Telehealth: Payer: Self-pay | Admitting: Orthopaedic Surgery

## 2019-07-23 NOTE — Telephone Encounter (Signed)
Pt called in stating he had surgery on 06-23-19 and wanted to know if it would be ok if he got the covid shots?  [phone]

## 2019-07-23 NOTE — Telephone Encounter (Signed)
yes

## 2019-07-24 NOTE — Telephone Encounter (Signed)
Called patient no answer. LMOM with details.  

## 2019-08-06 ENCOUNTER — Ambulatory Visit (INDEPENDENT_AMBULATORY_CARE_PROVIDER_SITE_OTHER): Payer: BC Managed Care – PPO | Admitting: Orthopaedic Surgery

## 2019-08-06 ENCOUNTER — Other Ambulatory Visit: Payer: Self-pay

## 2019-08-06 ENCOUNTER — Ambulatory Visit: Payer: Self-pay

## 2019-08-06 ENCOUNTER — Encounter: Payer: Self-pay | Admitting: Orthopaedic Surgery

## 2019-08-06 DIAGNOSIS — Z96642 Presence of left artificial hip joint: Secondary | ICD-10-CM | POA: Diagnosis not present

## 2019-08-06 DIAGNOSIS — G8929 Other chronic pain: Secondary | ICD-10-CM | POA: Diagnosis not present

## 2019-08-06 DIAGNOSIS — M25562 Pain in left knee: Secondary | ICD-10-CM

## 2019-08-06 MED ORDER — BUPIVACAINE HCL 0.5 % IJ SOLN
2.0000 mL | INTRAMUSCULAR | Status: AC | PRN
  Administered 2019-08-06: 2 mL via INTRA_ARTICULAR

## 2019-08-06 MED ORDER — LIDOCAINE HCL 1 % IJ SOLN
2.0000 mL | INTRAMUSCULAR | Status: AC | PRN
  Administered 2019-08-06: 2 mL

## 2019-08-06 MED ORDER — METHYLPREDNISOLONE ACETATE 40 MG/ML IJ SUSP
40.0000 mg | INTRAMUSCULAR | Status: AC | PRN
  Administered 2019-08-06: 40 mg via INTRA_ARTICULAR

## 2019-08-06 NOTE — Progress Notes (Signed)
Office Visit Note   Patient: Jeffrey Mccormick           Date of Birth: 08-12-1960           MRN: [id number] Visit Date: 08/06/2019              Requested by: No referring provider defined for this encounter. PCP: Patient, No Pcp Per   Assessment & Plan: Visit Diagnoses:  1. Chronic pain of left knee   2. H/O total hip arthroplasty, left     Plan: My impression is 6 weeks status post left total hip placement doing well without any issues.  Continue with PT for strengthening.  Increase activity as tolerated.  In terms of the left knee pain this can be due to degenerative lateral meniscal tear versus IT band syndrome versus chondromalacia.  Based on discussion of treatment options we went ahead and did a cortisone injection today to the left knee.  He tolerated this well.  We will see him back in 6 weeks for follow-up of his left hip replacement.  Follow-Up Instructions: Return in about 6 weeks (around 09/17/2019).   Orders:  Orders Placed This Encounter  Procedures  . XR KNEE 3 VIEW LEFT  . XR HIP UNILAT W OR W/O PELVIS 2-3 VIEWS LEFT   No orders of the defined types were placed in this encounter.     Procedures: Large Joint Inj: L knee on 08/06/2019 1:22 PM Details: 22 G needle Medications: 2 mL bupivacaine 0.5 %; 2 mL lidocaine 1 %; 40 mg methylPREDNISolone acetate 40 MG/ML Outcome: tolerated well, no immediate complications Patient was prepped and draped in the usual sterile fashion.       Clinical Data: No additional findings.   Subjective: Chief Complaint  Patient presents with  . Left Hip - Pain    Aubra is 6 weeks s/p left total hip replacement.  He has come well overall.  [name] having some increased fluid retention.  [name] mainly complaining a separate issue with his left knee causing pain on the lateral side.  Denies any definite injuries.  He does try to play golf 2-3 times a week.  [name] still doing physical therapy for his hip replacement.  He denies any  mechanical symptoms related to the knee.   Review of Systems  Constitutional: Negative.   All other systems reviewed and are negative.    Objective: Vital Signs: There were no vitals taken for this visit.  Physical Exam Vitals and nursing note reviewed.  Constitutional:      Appearance: [name] well-developed.  Pulmonary:     Effort: Pulmonary effort is normal.  Abdominal:     Palpations: Abdomen is soft.  Skin:    General: Skin is warm.  Neurological:     Mental Status: [name] alert and oriented to person, place, and time.  Psychiatric:        Behavior: Behavior normal.        Thought Content: Thought content normal.        Judgment: Judgment normal.     Ortho Exam Left hip shows a fully healed surgical scar.  [name] ambulating well overall.  Left knee shows no joint effusion.  Slight decreased range of motion secondary to pain.  Collaterals and cruciates are stable.  Minimal lateral joint line tenderness.  IT band is not significantly tender. Specialty Comments:  No specialty comments available.  Imaging: XR HIP UNILAT W OR W/O PELVIS 2-3 VIEWS LEFT  Result Date: 08/06/2019 Stable total hip replacement without complication  XR KNEE 3 VIEW LEFT  Result Date: 08/06/2019 Mild ostetoarthritis.  No acute findings.  Periarticular spurring of the patella.    PMFS History: Patient Active Problem List   Diagnosis Date Noted  . Primary osteoarthritis of left hip 06/23/2019  . Status post total replacement of left hip 06/23/2019  . Chronic pain of left knee 08/23/2017  . Hip joint replacement status 05/25/2016   Past Medical History:  Diagnosis Date  . Arthritis   . Asthma    Albuterol inhaler as needed. Also has Neb treatment   . GERD (gastroesophageal reflux disease)    takes Protonix daily  . Headache    when he had Covid in December 2020 he had headaches  . History of blood clots    pt states 20+ yrs ago-superficial  . History of bronchitis 2016  . History  of colon polyps    benign  . History of gout   . History of hiatal hernia   . History of hyperlipidemia    was on meds but off for about 6 + yrs  . History of kidney stones   . Hypertension    takes Amlodipine daily  . Joint pain   . Pneumonia    hx of-several yrs ago  . Sleep apnea    does not use a cpap    History reviewed. No pertinent family history.  Past Surgical History:  Procedure Laterality Date  . BACK SURGERY  2013   fusion-at [facility]  . CARDIAC CATHETERIZATION     RHC/LHC 05/20/17 ([facility]): Mild non-obstructive CAD (minor irregulairies mid LAD up to 25%, 10-15% mid RCA), EF 60%, RA 3, RV 30/6, PA 30/10, PAWP 5 mean, 10 end exp, COthermodilution 6L/min, COfick 4.5, norrmal renal arteries  . COLONOSCOPY    . DOPPLER ECHOCARDIOGRAPHY     TTE 02/28/19 ([facility]): Poor technical quality, mildly dilated LV with moderately severe concentric LVH, EF 60-65%, normal RV size and function, grossly normal valves  . ESOPHAGOGASTRODUODENOSCOPY    . LITHOTRIPSY    . SHOULDER SURGERY Left   . TONSILLECTOMY  as a teenager  . TOTAL HIP ARTHROPLASTY Right 05/25/2016   Procedure: RIGHT TOTAL HIP ARTHROPLASTY ANTERIOR APPROACH;  Surgeon: Leandrew Koyanagi, MD;  Location: [place];  Service: Orthopedics;  Laterality: Right;  . TOTAL HIP ARTHROPLASTY Left 06/23/2019   Procedure: LEFT TOTAL HIP ARTHROPLASTY ANTERIOR APPROACH;  Surgeon: Leandrew Koyanagi, MD;  Location: [place];  Service: Orthopedics;  Laterality: Left;  . UMBILICAL HERNIA REPAIR     Social History   Occupational History  . Not on file  Tobacco Use  . Smoking status: Current Some Day Smoker    Types: Cigars  . Smokeless tobacco: Current User    Types: Snuff  . Tobacco comment: only smokes when he plays golf  Substance and Sexual Activity  . Alcohol use: Yes    Comment:  beer 3-4 times a week  . Drug use: No  . Sexual activity: Yes

## 2019-08-09 ENCOUNTER — Other Ambulatory Visit: Payer: Self-pay | Admitting: Physician Assistant

## 2019-09-17 ENCOUNTER — Ambulatory Visit (INDEPENDENT_AMBULATORY_CARE_PROVIDER_SITE_OTHER): Payer: BC Managed Care – PPO | Admitting: Orthopaedic Surgery

## 2019-09-17 ENCOUNTER — Other Ambulatory Visit: Payer: Self-pay

## 2019-09-17 DIAGNOSIS — Z96642 Presence of left artificial hip joint: Secondary | ICD-10-CM

## 2019-09-17 NOTE — Progress Notes (Signed)
Post-Op Visit Note   Patient: Jeffrey Mccormick           Date of Birth: 1960-05-28           MRN: [id number] Visit Date: 09/17/2019 PCP: Patient, No Pcp Per   Assessment & Plan:  Chief Complaint:  Chief Complaint  Patient presents with  . Left Hip - Pain, Follow-up   Visit Diagnoses:  1. Status post total replacement of left hip     Plan: Jatavian is 3 months status post left total hip replacement.  [name] doing well overall.  He continues to work on strengthening doing physical therapy twice a week.  Knee has start up stiffness and occasional pain that he takes hydrocodone for.  [name] unable to play golf yet.  He still feels significant weakness and lack of stamina.  Surgical scar is fully healed.  He has got good range of motion of the hip.  He continues to have weakness in his hip flexors and slight limp to ambulation.  I feel that he would benefit from another 2 weeks of strengthening and PT before he returns back to work since his job is very physical.  Work note provided today for return on 09/29/2019.  Recheck in 3 months with standing AP pelvis and lateral hip.  Dental prophylaxis reinforced.  Follow-Up Instructions: Return in about 3 months (around 12/18/2019).   Orders:  No orders of the defined types were placed in this encounter.  No orders of the defined types were placed in this encounter.   Imaging: No results found.  PMFS History: Patient Active Problem List   Diagnosis Date Noted  . Primary osteoarthritis of left hip 06/23/2019  . Status post total replacement of left hip 06/23/2019  . Chronic pain of left knee 08/23/2017  . Hip joint replacement status 05/25/2016   Past Medical History:  Diagnosis Date  . Arthritis   . Asthma    Albuterol inhaler as needed. Also has Neb treatment   . GERD (gastroesophageal reflux disease)    takes Protonix daily  . Headache    when he had Covid in December 2020 he had headaches  . History of blood clots    pt states 20+ yrs  ago-superficial  . History of bronchitis 2016  . History of colon polyps    benign  . History of gout   . History of hiatal hernia   . History of hyperlipidemia    was on meds but off for about 6 + yrs  . History of kidney stones   . Hypertension    takes Amlodipine daily  . Joint pain   . Pneumonia    hx of-several yrs ago  . Sleep apnea    does not use a cpap    No family history on file.  Past Surgical History:  Procedure Laterality Date  . BACK SURGERY  2013   fusion-at [facility]  . CARDIAC CATHETERIZATION     RHC/LHC 05/20/17 ([facility]): Mild non-obstructive CAD (minor irregulairies mid LAD up to 25%, 10-15% mid RCA), EF 60%, RA 3, RV 30/6, PA 30/10, PAWP 5 mean, 10 end exp, COthermodilution 6L/min, COfick 4.5, norrmal renal arteries  . COLONOSCOPY    . DOPPLER ECHOCARDIOGRAPHY     TTE 02/28/19 ([facility]): Poor technical quality, mildly dilated LV with moderately severe concentric LVH, EF 60-65%, normal RV size and function, grossly normal valves  . ESOPHAGOGASTRODUODENOSCOPY    . LITHOTRIPSY    . SHOULDER SURGERY Left   .  TONSILLECTOMY  as a teenager  . TOTAL HIP ARTHROPLASTY Right 05/25/2016   Procedure: RIGHT TOTAL HIP ARTHROPLASTY ANTERIOR APPROACH;  Surgeon: Leandrew Koyanagi, MD;  Location: [place];  Service: Orthopedics;  Laterality: Right;  . TOTAL HIP ARTHROPLASTY Left 06/23/2019   Procedure: LEFT TOTAL HIP ARTHROPLASTY ANTERIOR APPROACH;  Surgeon: Leandrew Koyanagi, MD;  Location: [place];  Service: Orthopedics;  Laterality: Left;  . UMBILICAL HERNIA REPAIR     Social History   Occupational History  . Not on file  Tobacco Use  . Smoking status: Current Some Day Smoker    Types: Cigars  . Smokeless tobacco: Current User    Types: Snuff  . Tobacco comment: only smokes when he plays golf  Substance and Sexual Activity  . Alcohol use: Yes    Comment:  beer 3-4 times a week  . Drug use: No  . Sexual activity: Yes

## 2019-10-01 ENCOUNTER — Telehealth: Payer: Self-pay

## 2019-10-01 ENCOUNTER — Ambulatory Visit: Payer: BC Managed Care – PPO | Admitting: Orthopaedic Surgery

## 2019-10-01 ENCOUNTER — Other Ambulatory Visit: Payer: Self-pay

## 2019-10-01 ENCOUNTER — Encounter: Payer: Self-pay | Admitting: Orthopaedic Surgery

## 2019-10-01 ENCOUNTER — Ambulatory Visit: Payer: Self-pay

## 2019-10-01 DIAGNOSIS — Z96642 Presence of left artificial hip joint: Secondary | ICD-10-CM | POA: Diagnosis not present

## 2019-10-01 MED ORDER — IBUPROFEN-FAMOTIDINE 800-26.6 MG PO TABS: 1.0000 | ORAL_TABLET | Freq: Three times a day (TID) | ORAL | 0 refills | Status: AC | PRN

## 2019-10-01 NOTE — Telephone Encounter (Signed)
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 He thinks he did something to his hip.  Please advise

## 2019-10-01 NOTE — Telephone Encounter (Signed)
sure

## 2019-10-01 NOTE — Progress Notes (Signed)
Office Visit Note   Patient: Jeffrey Mccormick           Date of Birth: 1960/07/10           MRN: [id number] Visit Date: 10/01/2019              Requested by: No referring provider defined for this encounter. PCP: Patient, No Pcp Per   Assessment & Plan: Visit Diagnoses:  1. Status post total replacement of left hip     Plan: Impression is overuse of left hip flexors.  I have called in a prescription for Duexis.  We will keep him out of work for a week to allow him to rest.  Questions encouraged and answered.  Follow-up as scheduled.  Follow-Up Instructions: Return if symptoms worsen or fail to improve.   Orders:  Orders Placed This Encounter  Procedures  . XR HIP UNILAT W OR W/O PELVIS 2-3 VIEWS LEFT   Meds ordered this encounter  Medications  . Ibuprofen-Famotidine 800-26.6 MG TABS    Sig: Take 1 tablet by mouth 3 (three) times daily as needed.    Dispense:  90 tablet    Refill:  0      Procedures: No procedures performed   Clinical Data: No additional findings.   Subjective: Chief Complaint  Patient presents with  . Left Hip - Pain    Jeffrey Mccormick is 3 months and 11 days status post left total hip replacement.  He has been doing fine until this past week in which she had to perform a physical test in order to return back to work.  He had to do many difficult tasks.  He was able to play golf on Saturday and then on Sunday the pain started abruptly.  He has 9 out of 10 pain when he tries to flex his hip.  No pain at rest.  No fevers or chills.  No warmth or swelling or tenderness of the thigh.  No radicular symptoms.   Review of Systems  Constitutional: Negative.   All other systems reviewed and are negative.    Objective: Vital Signs: There were no vitals taken for this visit.  Physical Exam Vitals and nursing note reviewed.  Constitutional:      Appearance: [name] well-developed.  Pulmonary:     Effort: Pulmonary effort is normal.  Abdominal:     Palpations:  Abdomen is soft.  Skin:    General: Skin is warm.  Neurological:     Mental Status: [name] alert and oriented to person, place, and time.  Psychiatric:        Behavior: Behavior normal.        Thought Content: Thought content normal.        Judgment: Judgment normal.     Ortho Exam Left hip shows a fully healed surgical scar.  There is no warmth or induration or swelling or fluctuance or any signs of infection.  Range of motion of the hip does not produce any significant pain.  He has extreme pain when he tries to flex his hip against gravity. Specialty Comments:  No specialty comments available.  Imaging: XR HIP UNILAT W OR W/O PELVIS 2-3 VIEWS LEFT  Result Date: 4/62/7035 No complications with the hip replacements    PMFS History: Patient Active Problem List   Diagnosis Date Noted  . Primary osteoarthritis of left hip 06/23/2019  . Status post total replacement of left hip 06/23/2019  . Chronic pain of left knee 08/23/2017  .  Hip joint replacement status 05/25/2016   Past Medical History:  Diagnosis Date  . Arthritis   . Asthma    Albuterol inhaler as needed. Also has Neb treatment   . GERD (gastroesophageal reflux disease)    takes Protonix daily  . Headache    when he had Covid in December 2020 he had headaches  . History of blood clots    pt states 20+ yrs ago-superficial  . History of bronchitis 2016  . History of colon polyps    benign  . History of gout   . History of hiatal hernia   . History of hyperlipidemia    was on meds but off for about 6 + yrs  . History of kidney stones   . Hypertension    takes Amlodipine daily  . Joint pain   . Pneumonia    hx of-several yrs ago  . Sleep apnea    does not use a cpap    History reviewed. No pertinent family history.  Past Surgical History:  Procedure Laterality Date  . BACK SURGERY  2013   fusion-at [facility]  . CARDIAC CATHETERIZATION     RHC/LHC 05/20/17 ([facility]): Mild non-obstructive CAD (minor  irregulairies mid LAD up to 25%, 10-15% mid RCA), EF 60%, RA 3, RV 30/6, PA 30/10, PAWP 5 mean, 10 end exp, COthermodilution 6L/min, COfick 4.5, norrmal renal arteries  . COLONOSCOPY    . DOPPLER ECHOCARDIOGRAPHY     TTE 02/28/19 ([facility]): Poor technical quality, mildly dilated LV with moderately severe concentric LVH, EF 60-65%, normal RV size and function, grossly normal valves  . ESOPHAGOGASTRODUODENOSCOPY    . LITHOTRIPSY    . SHOULDER SURGERY Left   . TONSILLECTOMY  as a teenager  . TOTAL HIP ARTHROPLASTY Right 05/25/2016   Procedure: RIGHT TOTAL HIP ARTHROPLASTY ANTERIOR APPROACH;  Surgeon: Tarry Kos, MD;  Location: [facility];  Service: Orthopedics;  Laterality: Right;  . TOTAL HIP ARTHROPLASTY Left 06/23/2019   Procedure: LEFT TOTAL HIP ARTHROPLASTY ANTERIOR APPROACH;  Surgeon: Tarry Kos, MD;  Location: [facility];  Service: Orthopedics;  Laterality: Left;  . UMBILICAL HERNIA REPAIR     Social History   Occupational History  . Not on file  Tobacco Use  . Smoking status: Current Some Day Smoker    Types: Cigars  . Smokeless tobacco: Current User    Types: Snuff  . Tobacco comment: only smokes when he plays golf  Substance and Sexual Activity  . Alcohol use: Yes    Comment:  beer 3-4 times a week  . Drug use: No  . Sexual activity: Yes

## 2019-10-18 ENCOUNTER — Inpatient Hospital Stay
Admission: AD | Admit: 2019-10-18 | Payer: BC Managed Care – PPO | Source: Other Acute Inpatient Hospital | Admitting: Internal Medicine

## 2019-12-16 ENCOUNTER — Ambulatory Visit: Payer: BC Managed Care – PPO | Admitting: Orthopaedic Surgery

## 2020-04-27 ENCOUNTER — Telehealth: Payer: Self-pay | Admitting: Orthopaedic Surgery

## 2020-04-27 NOTE — Telephone Encounter (Signed)
Received S/T disability paperwork   Forwarding to [employer] today 

## 2020-05-19 ENCOUNTER — Ambulatory Visit: Payer: BC Managed Care – PPO | Admitting: Orthopaedic Surgery

## 2020-05-31 IMAGING — DX DG PORTABLE PELVIS
1 series · 1 of 1 positions shown · non-contrast
Comparison: Left hip x-rays dated May 20, 2019.

CLINICAL DATA: Left hip replacement.

EXAM:
PORTABLE PELVIS 1-2 VIEWS

[pelvis ap]
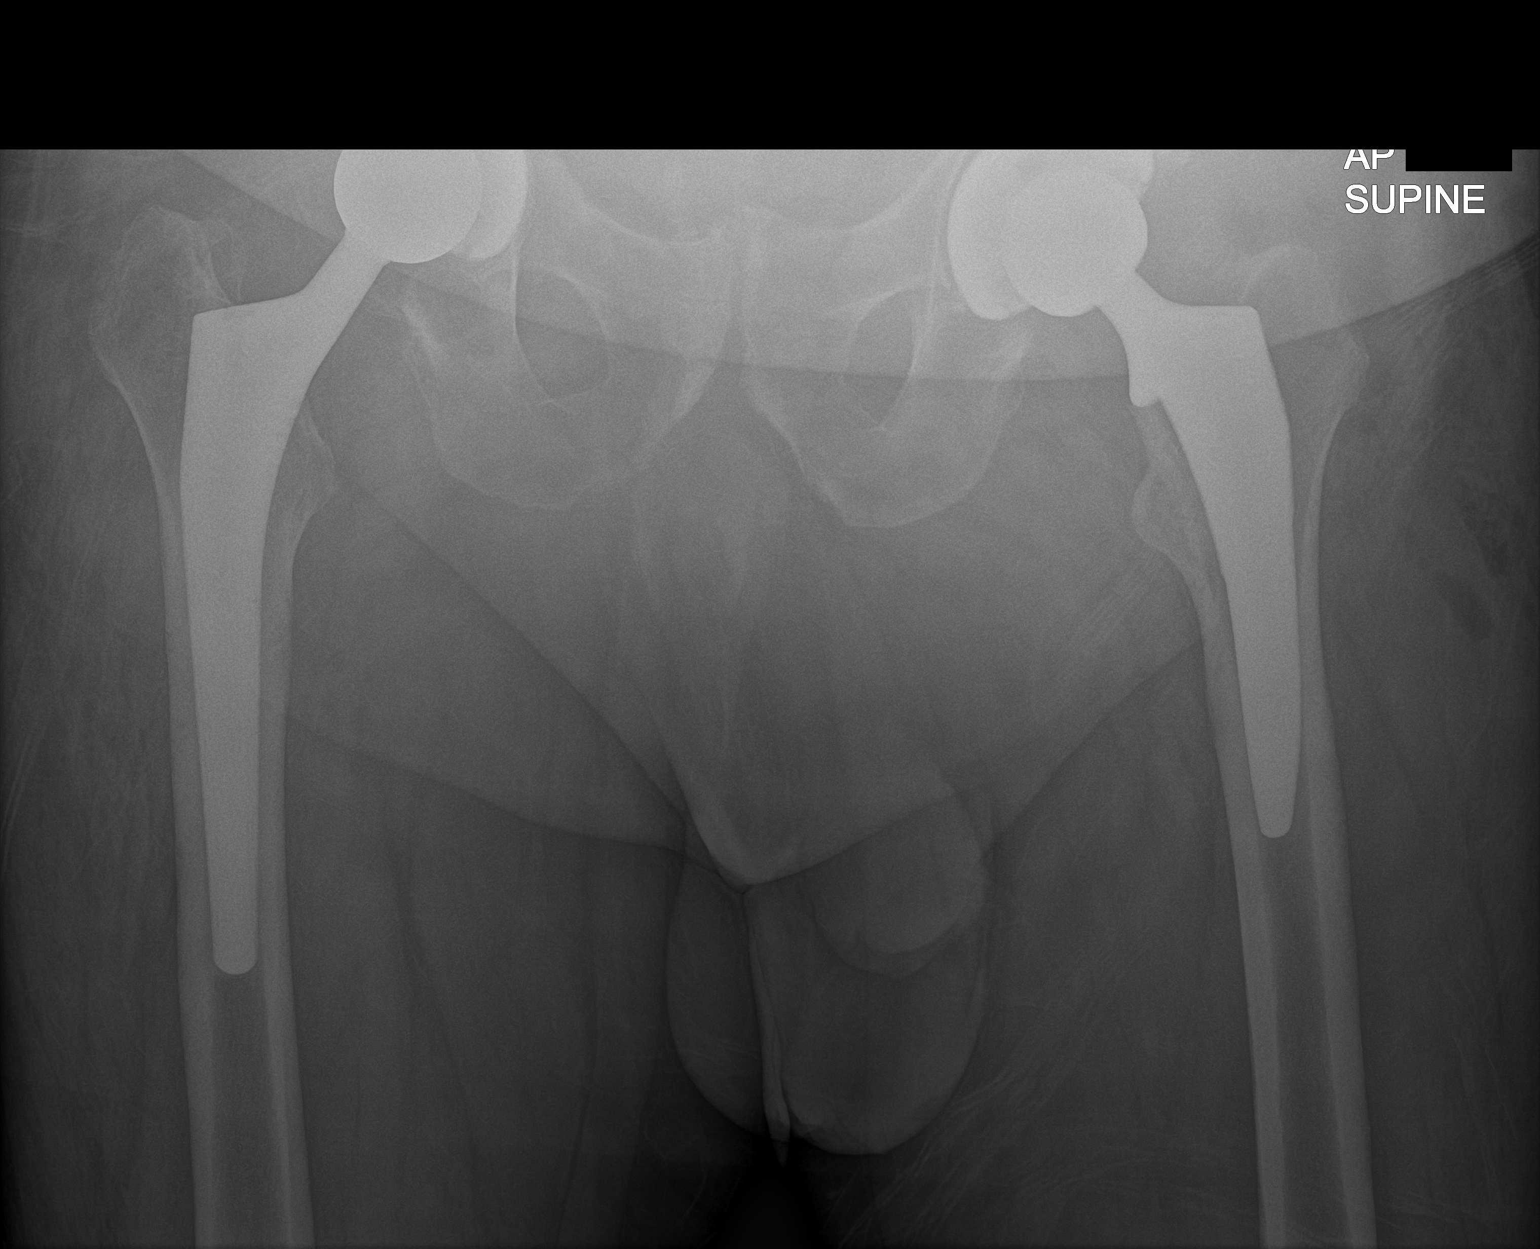

[1 of 1 positions shown; findings below may reference images not displayed]

FINDINGS: The left hip demonstrates a total arthroplasty without evidence of
hardware failure or complication. There is no fracture or
dislocation. The alignment is anatomic. Post-surgical changes noted
in the surrounding soft tissues. Prior right total hip arthroplasty.
IMPRESSION: Interval left total hip arthroplasty without acute postoperative
complication.
